# Patient Record
Sex: Male | Born: 1980
Health system: Southern US, Community
[De-identification: ages and names within clinical notes are randomized; demographics above are authoritative.]

## PROBLEM LIST (undated history)

## (undated) DIAGNOSIS — T783XXA Angioneurotic edema, initial encounter: Secondary | ICD-10-CM

## (undated) DIAGNOSIS — I10 Essential (primary) hypertension: Secondary | ICD-10-CM

## (undated) DIAGNOSIS — L509 Urticaria, unspecified: Secondary | ICD-10-CM

## (undated) DIAGNOSIS — Z8619 Personal history of other infectious and parasitic diseases: Secondary | ICD-10-CM

## (undated) HISTORY — DX: Urticaria, unspecified: L50.9

## (undated) HISTORY — DX: Personal history of other infectious and parasitic diseases: Z86.19

## (undated) HISTORY — DX: Angioneurotic edema, initial encounter: T78.3XXA

## (undated) HISTORY — DX: Essential (primary) hypertension: I10

## (undated) HISTORY — PX: NO PAST SURGERIES: SHX2092

---
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
## 2001-12-16 ENCOUNTER — Encounter: Payer: Self-pay | Admitting: Emergency Medicine

## 2001-12-16 ENCOUNTER — Emergency Department (HOSPITAL_COMMUNITY): Admission: EM | Admit: 2001-12-16 | Discharge: 2001-12-17 | Payer: Self-pay | Admitting: Emergency Medicine

## 2002-01-02 ENCOUNTER — Encounter: Admission: RE | Admit: 2002-01-02 | Discharge: 2002-04-02 | Payer: Self-pay | Admitting: Family Medicine

## 2012-01-03 ENCOUNTER — Emergency Department (HOSPITAL_BASED_OUTPATIENT_CLINIC_OR_DEPARTMENT_OTHER)
Admission: EM | Admit: 2012-01-03 | Discharge: 2012-01-03 | Disposition: A | Discharge status: Home / Self Care | Payer: No Typology Code available for payment source | Attending: Emergency Medicine | Admitting: Emergency Medicine

## 2012-01-03 ENCOUNTER — Encounter (HOSPITAL_BASED_OUTPATIENT_CLINIC_OR_DEPARTMENT_OTHER): Payer: Self-pay | Admitting: *Deleted

## 2012-01-03 DIAGNOSIS — Z043 Encounter for examination and observation following other accident: Secondary | ICD-10-CM | POA: Insufficient documentation

## 2012-01-03 DIAGNOSIS — S161XXA Strain of muscle, fascia and tendon at neck level, initial encounter: Secondary | ICD-10-CM

## 2012-01-03 DIAGNOSIS — M549 Dorsalgia, unspecified: Secondary | ICD-10-CM

## 2012-01-03 MED ORDER — IBUPROFEN 800 MG PO TABS: 800.0000 mg | ORAL_TABLET | Freq: Three times a day (TID) | ORAL | Status: AC

## 2012-01-03 MED ORDER — CYCLOBENZAPRINE HCL 10 MG PO TABS: 10.0000 mg | ORAL_TABLET | Freq: Two times a day (BID) | ORAL | Status: AC | PRN
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
## 2012-01-21 ENCOUNTER — Ambulatory Visit
Payer: No Typology Code available for payment source | Attending: Family Medicine | Admitting: Rehabilitative and Restorative Service Providers"

## 2012-01-21 DIAGNOSIS — M545 Low back pain, unspecified: Secondary | ICD-10-CM | POA: Insufficient documentation

## 2012-01-21 DIAGNOSIS — M542 Cervicalgia: Secondary | ICD-10-CM | POA: Insufficient documentation

## 2012-01-21 DIAGNOSIS — IMO0001 Reserved for inherently not codable concepts without codable children: Secondary | ICD-10-CM | POA: Insufficient documentation

## 2012-01-21 DIAGNOSIS — M2569 Stiffness of other specified joint, not elsewhere classified: Secondary | ICD-10-CM | POA: Insufficient documentation

## 2012-01-28 ENCOUNTER — Ambulatory Visit: Payer: No Typology Code available for payment source | Admitting: Physical Therapy

## 2012-02-01 ENCOUNTER — Ambulatory Visit: Payer: No Typology Code available for payment source | Admitting: Rehabilitative and Restorative Service Providers"

## 2012-02-01 ENCOUNTER — Ambulatory Visit: Payer: Self-pay | Admitting: Internal Medicine

## 2012-02-03 ENCOUNTER — Ambulatory Visit: Payer: No Typology Code available for payment source | Admitting: Rehabilitative and Restorative Service Providers"

## 2012-02-04 ENCOUNTER — Ambulatory Visit (INDEPENDENT_AMBULATORY_CARE_PROVIDER_SITE_OTHER): Payer: Managed Care, Other (non HMO) | Admitting: Family Medicine

## 2012-02-04 ENCOUNTER — Encounter: Payer: Self-pay | Admitting: Family Medicine

## 2012-02-04 VITALS — BP 125/88 | HR 75 | Ht 72.25 in | Wt 360.0 lb

## 2012-02-04 DIAGNOSIS — E785 Hyperlipidemia, unspecified: Secondary | ICD-10-CM

## 2012-02-04 DIAGNOSIS — Z0000 Encounter for general adult medical examination without abnormal findings: Secondary | ICD-10-CM

## 2012-02-04 LAB — POCT URINALYSIS DIPSTICK
Blood, UA: NEGATIVE
Glucose, UA: NEGATIVE
Ketones, UA: NEGATIVE
Nitrite, UA: NEGATIVE
Protein, UA: NEGATIVE
Spec Grav, UA: 1.02
Urobilinogen, UA: 1
pH, UA: 7

## 2012-02-04 NOTE — Patient Instructions (Signed)

## 2012-02-04 NOTE — Progress Notes (Signed)
Subjective:    Patient ID: Wesley Obrien, male    DOB: 07-26-1980, 31 y.o.   MRN: [id number]  HPI States everything is well health maintenance examination.   Review of Systems  Neurological: Positive for headaches.       Wesley Obrien has had a history of what Wesley Obrien called a migraine headaches. Wesley Obrien states that she's been told before by physicians as long as it doesn't cause major problems Wesley Obrien can do with it as Wesley Obrien's been doing. But mainly means trying to lay down when Wesley Obrien has a headache and getting some rest. Wesley Obrien states headaches only occurs maybe once every 2 months. Did tell him if it gets worse may need to consider a tryptan.  All other systems reviewed and are negative.    No Known Allergies History   Social History  . Marital Status: Single    Spouse Name: N/A    Number of Children: N/A  . Years of Education: N/A   Occupational History  . Not on file.   Social History Main Topics  . Smoking status: Current Some Day Smoker    Types: Cigars  . Smokeless tobacco: Never Used  . Alcohol Use: Yes  . Drug Use: No  . Sexually Active: Yes    Birth Control/ Protection: None   Other Topics Concern  . Not on file   Social History Narrative  . No narrative on file   Family History  Problem Relation Age of Onset  . Diabetes Father    History reviewed. No pertinent past medical history. History reviewed. No pertinent past surgical history.     BP 125/88  Pulse 75  Ht 6' 0.25" (1.835 m)  Wt 360 lb (163.295 kg)  BMI 48.49 kg/m2  SpO2 98% Objective:   Physical Exam  Vitals reviewed. Constitutional: Wesley Obrien is oriented to person, place, and time. Wesley Obrien appears well-developed and well-nourished. No distress.       Obese black male  HENT:  Head: Normocephalic and atraumatic.  Eyes: Conjunctivae normal and EOM are normal. Pupils are equal, round, and reactive to light. Right conjunctiva is not injected. Left conjunctiva is not injected.  Fundoscopic exam:      The right eye shows no arteriolar  narrowing, no AV nicking and no exudate.       The left eye shows no arteriolar narrowing, no AV nicking and no exudate.  Neck: Normal range of motion. Neck supple. No tracheal deviation present. No mass and no thyromegaly present.  Cardiovascular: Normal rate, regular rhythm and normal heart sounds.  Exam reveals no friction rub.   No murmur heard. Pulmonary/Chest: Effort normal and breath sounds normal. No respiratory distress. Wesley Obrien has no wheezes.  Abdominal: Soft. Bowel sounds are normal. Wesley Obrien exhibits no distension. There is no tenderness.  Genitourinary: Penis normal. No penile tenderness.  Musculoskeletal: Normal range of motion. Wesley Obrien exhibits no edema and no tenderness.  Neurological: Wesley Obrien is alert and oriented to person, place, and time. Wesley Obrien has normal reflexes. Wesley Obrien displays normal reflexes. No cranial nerve deficit. Coordination normal.  Skin: Skin is warm and dry. No rash noted. No erythema.  Psychiatric: Wesley Obrien has a normal mood and affect. His behavior is normal.   Results for orders placed in visit on 02/04/12  POCT URINALYSIS DIPSTICK      Component Value Range   Color, UA yellow     Clarity, UA clear     Glucose, UA neg     Bilirubin, UA neg     Ketones,  UA neg     Spec Grav, UA 1.020     Blood, UA neg     pH, UA 7.0     Protein, UA neg     Urobilinogen, UA 1.0     Nitrite, UA neg     Leukocytes, UA Negative        Assessment & Plan:  #1 metabolic syndrome. Explained to him my concern over his weight gain and elevated lipids found on screening test. Warned that diabetes can developed as well as hypertension and a heart disease. Wesley Obrien is going to hopefully start exercising we talked about the possible use of fish oil. Wesley Obrien is also been juicing to try to be healthier but I explained to him total calories have to be accounted for a while Wesley Obrien is getting good nutrition this may be more calories than Wesley Obrien can handle or need Wesley Obrien does admit to gaining weight we'll Wesley Obrien's been doing that  activity.  Given to patient information on a good multivitamin , and fish oil. Also weight loss program that is nonpharmaceutical. We'll have him return 6 months for followup and to go downstairs for lab work.  Offered flu shot patient declined.

## 2012-02-05 LAB — CBC WITH DIFFERENTIAL/PLATELET
Eosinophils Absolute: 0.1 10*3/uL (ref 0.0–0.7)
Eosinophils Relative: 1 % (ref 0–5)
Hemoglobin: 14.4 g/dL (ref 13.0–17.0)
Lymphs Abs: 1.6 10*3/uL (ref 0.7–4.0)
MCH: 28.9 pg (ref 26.0–34.0)
MCHC: 34.5 g/dL (ref 30.0–36.0)
MCV: 83.7 fL (ref 78.0–100.0)
Monocytes Absolute: 0.5 10*3/uL (ref 0.1–1.0)
Monocytes Relative: 8 % (ref 3–12)
Platelets: 222 10*3/uL (ref 150–400)
RBC: 4.98 MIL/uL (ref 4.22–5.81)
RDW: 14 % (ref 11.5–15.5)
WBC: 6.5 10*3/uL (ref 4.0–10.5)

## 2012-02-05 LAB — HEMOGLOBIN A1C: Hgb A1c MFr Bld: 6.3 % — ABNORMAL HIGH (ref <5.7)

## 2012-02-05 LAB — COMPREHENSIVE METABOLIC PANEL
AST: 18 U/L (ref 0–37)
Albumin: 4.3 g/dL (ref 3.5–5.2)
BUN: 11 mg/dL (ref 6–23)
CO2: 24 mEq/L (ref 19–32)
Calcium: 9.5 mg/dL (ref 8.4–10.5)
Creat: 1.07 mg/dL (ref 0.50–1.35)
Glucose, Bld: 95 mg/dL (ref 70–99)
Potassium: 4.3 mEq/L (ref 3.5–5.3)
Sodium: 137 mEq/L (ref 135–145)
Total Bilirubin: 0.9 mg/dL (ref 0.3–1.2)
Total Protein: 7.3 g/dL (ref 6.0–8.3)

## 2012-02-05 LAB — LIPID PANEL
Cholesterol: 204 mg/dL — ABNORMAL HIGH (ref 0–200)
HDL: 37 mg/dL — ABNORMAL LOW (ref >39)
Total CHOL/HDL Ratio: 5.5 Ratio
Triglycerides: 82 mg/dL (ref <150)
VLDL: 16 mg/dL (ref 0–40)

## 2012-02-05 LAB — TSH: TSH: 1.974 u[IU]/mL (ref 0.350–4.500)

## 2012-02-09 ENCOUNTER — Encounter: Payer: Self-pay | Admitting: Family Medicine

## 2012-02-10 ENCOUNTER — Ambulatory Visit: Payer: No Typology Code available for payment source | Admitting: Physical Therapy

## 2012-02-11 ENCOUNTER — Ambulatory Visit: Payer: No Typology Code available for payment source | Admitting: Rehabilitative and Restorative Service Providers"

## 2012-02-16 ENCOUNTER — Encounter: Payer: Self-pay | Admitting: Rehabilitative and Restorative Service Providers"

## 2012-02-18 ENCOUNTER — Encounter: Payer: Self-pay | Admitting: Rehabilitative and Restorative Service Providers"

## 2012-02-24 ENCOUNTER — Ambulatory Visit
Payer: No Typology Code available for payment source | Attending: Family Medicine | Admitting: Rehabilitative and Restorative Service Providers"

## 2012-02-24 DIAGNOSIS — M542 Cervicalgia: Secondary | ICD-10-CM | POA: Insufficient documentation

## 2012-02-24 DIAGNOSIS — M545 Low back pain, unspecified: Secondary | ICD-10-CM | POA: Insufficient documentation

## 2012-02-24 DIAGNOSIS — IMO0001 Reserved for inherently not codable concepts without codable children: Secondary | ICD-10-CM | POA: Insufficient documentation

## 2012-02-24 DIAGNOSIS — M2569 Stiffness of other specified joint, not elsewhere classified: Secondary | ICD-10-CM | POA: Insufficient documentation

## 2012-06-04 ENCOUNTER — Other Ambulatory Visit: Payer: Self-pay

## 2013-02-23 ENCOUNTER — Other Ambulatory Visit: Payer: Self-pay

## 2014-07-29 ENCOUNTER — Emergency Department (HOSPITAL_COMMUNITY)
Admission: EM | Admit: 2014-07-29 | Discharge: 2014-07-29 | Disposition: A | Discharge status: Home / Self Care | Payer: Managed Care, Other (non HMO) | Attending: Emergency Medicine | Admitting: Emergency Medicine

## 2014-07-29 ENCOUNTER — Encounter (HOSPITAL_COMMUNITY): Payer: Self-pay | Admitting: *Deleted

## 2014-07-29 DIAGNOSIS — S3992XA Unspecified injury of lower back, initial encounter: Secondary | ICD-10-CM | POA: Diagnosis present

## 2014-07-29 DIAGNOSIS — Z720 Tobacco use: Secondary | ICD-10-CM | POA: Diagnosis not present

## 2014-07-29 DIAGNOSIS — Y9389 Activity, other specified: Secondary | ICD-10-CM | POA: Diagnosis not present

## 2014-07-29 DIAGNOSIS — Y92410 Unspecified street and highway as the place of occurrence of the external cause: Secondary | ICD-10-CM | POA: Insufficient documentation

## 2014-07-29 DIAGNOSIS — Y998 Other external cause status: Secondary | ICD-10-CM | POA: Insufficient documentation

## 2014-07-29 MED ORDER — OXYCODONE-ACETAMINOPHEN 5-325 MG PO TABS: 1.0000 | ORAL_TABLET | ORAL | Status: DC | PRN

## 2014-07-29 MED ORDER — IBUPROFEN 600 MG PO TABS: 600.0000 mg | ORAL_TABLET | Freq: Four times a day (QID) | ORAL | Status: DC | PRN

## 2014-07-29 MED ORDER — METHOCARBAMOL 750 MG PO TABS: 750.0000 mg | ORAL_TABLET | Freq: Four times a day (QID) | ORAL | Status: DC
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
## 2014-07-29 NOTE — Discharge Instructions (Signed)

## 2016-06-17 ENCOUNTER — Ambulatory Visit (INDEPENDENT_AMBULATORY_CARE_PROVIDER_SITE_OTHER): Payer: Managed Care, Other (non HMO) | Admitting: Medical

## 2016-06-17 ENCOUNTER — Encounter: Payer: Self-pay | Admitting: Medical

## 2016-06-17 VITALS — BP 136/72 | HR 86 | Temp 98.1°F | Resp 16 | Ht 72.25 in | Wt 357.2 lb

## 2016-06-17 DIAGNOSIS — Z0000 Encounter for general adult medical examination without abnormal findings: Secondary | ICD-10-CM

## 2016-06-17 DIAGNOSIS — Z113 Encounter for screening for infections with a predominantly sexual mode of transmission: Secondary | ICD-10-CM

## 2016-06-17 LAB — POC URINALSYSI DIPSTICK (AUTOMATED)
Bilirubin, UA: NEGATIVE
Blood, UA: NEGATIVE
Glucose, UA: NEGATIVE
KETONES UA: NEGATIVE
Leukocytes, UA: NEGATIVE
Nitrite, UA: NEGATIVE
Protein, UA: NEGATIVE
Urobilinogen, UA: 0.2
pH, UA: 6

## 2016-06-17 NOTE — Patient Instructions (Addendum)
For you wellness exam today I have ordered cbc, cmp, tsh, lipid panel, ua and hiv.  Flu vaccine declined today  Recommend exercise and healthy diet.  We will let you know lab results as they come in.  Follow up date appointment will be determined after lab review.   Bp well controlled. Continue your current bp med.   Preventive Care 18-39 Years, Male Preventive care refers to lifestyle choices and visits with your health care provider that can promote health and wellness. What does preventive care include?  A yearly physical exam. This is also called an annual well check.  Dental exams once or twice a year.  Routine eye exams. Ask your health care provider how often you should have your eyes checked.  Personal lifestyle choices, including:  Daily care of your teeth and gums.  Regular physical activity.  Eating a healthy diet.  Avoiding tobacco and drug use.  Limiting alcohol use.  Practicing safe sex. What happens during an annual well check? The services and screenings done by your health care provider during your annual well check will depend on your age, overall health, lifestyle risk factors, and family history of disease. Counseling  Your health care provider may ask you questions about your:  Alcohol use.  Tobacco use.  Drug use.  Emotional well-being.  Home and relationship well-being.  Sexual activity.  Eating habits.  Work and work Statistician. Screening  You may have the following tests or measurements:  Height, weight, and BMI.  Blood pressure.  Lipid and cholesterol levels. These may be checked every 5 years starting at age 20.  Diabetes screening. This is done by checking your blood sugar (glucose) after you have not eaten for a while (fasting).  Skin check.  Hepatitis C blood test.  Hepatitis B blood test.  Sexually transmitted disease (STD) testing. Discuss your test results, treatment options, and if necessary, the need for  more tests with your health care provider. Vaccines  Your health care provider may recommend certain vaccines, such as:  Influenza vaccine. This is recommended every year.  Tetanus, diphtheria, and acellular pertussis (Tdap, Td) vaccine. You may need a Td booster every 10 years.  Varicella vaccine. You may need this if you have not been vaccinated.  HPV vaccine. If you are 47 or younger, you may need three doses over 6 months.  Measles, mumps, and rubella (MMR) vaccine. You may need at least one dose of MMR.You may also need a second dose.  Pneumococcal 13-valent conjugate (PCV13) vaccine. You may need this if you have certain conditions and have not been vaccinated.  Pneumococcal polysaccharide (PPSV23) vaccine. You may need one or two doses if you smoke cigarettes or if you have certain conditions.  Meningococcal vaccine. One dose is recommended if you are age 29-21 years and a first-year college student living in a residence hall, or if you have one of several medical conditions. You may also need additional booster doses.  Hepatitis A vaccine. You may need this if you have certain conditions or if you travel or work in places where you may be exposed to hepatitis A.  Hepatitis B vaccine. You may need this if you have certain conditions or if you travel or work in places where you may be exposed to hepatitis B.  Haemophilus influenzae type b (Hib) vaccine. You may need this if you have certain risk factors. Talk to your health care provider about which screenings and vaccines you need and how often you need  them. This information is not intended to replace advice given to you by your health care provider. Make sure you discuss any questions you have with your health care provider. Document Released: 06/02/2001 Document Revised: 12/25/2015 Document Reviewed: 02/05/2015 Elsevier Interactive Patient Education  2017 [employer].

## 2016-06-17 NOTE — Progress Notes (Signed)
Subjective:    Patient ID: Wesley Obrien, male    DOB: Feb 01, 1981, 36 y.o.   MRN: [id number]  HPI  Pt if for first time.  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 Pt states not exercising past 2 months, pt states moderate healthy diet but can improve, 3 cans cheer wine a week, engaged and has one sone 35 yo.  Pt has htn. Pt states bp has been controlled. Pt on benciar 40/12.5 Dr. Irven EasterlyJerry Delbert Darley wrote rx. No cardiac or neurologic signs or symptoms.  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   Pt is up to date on his tdap. Pt declines flu vaccine.      Review of Systems  Constitutional: Negative for chills, fatigue and fever.  HENT: Negative for congestion, ear discharge, ear pain, facial swelling, hearing loss and mouth sores.   Respiratory: Negative for cough, choking, chest tightness, shortness of breath and wheezing.   Cardiovascular: Negative for chest pain and palpitations.  Gastrointestinal: Negative for abdominal pain, constipation, nausea and vomiting.  Genitourinary: Negative for dysuria, enuresis, flank pain, frequency, penile swelling, testicular pain and urgency.  Musculoskeletal: Negative for back pain, joint swelling and neck stiffness.  Skin: Negative for rash.  Neurological: Negative for dizziness, syncope, speech difficulty, weakness and headaches.  Hematological: Negative for adenopathy. Does not bruise/bleed easily.  Psychiatric/Behavioral: Negative for behavioral problems, confusion, dysphoric mood and sleep disturbance. The patient is not nervous/anxious.     Past Medical History:  Diagnosis Date  . History of chicken pox   . Hypertension      Social History   Social History  . Marital status: Single    Spouse name: N/A  . Number of children: N/A  . Years of education: N/A   Occupational History  . Not on file.   Social History Main  Topics  . Smoking status: Former Smoker    Types: Cigars  . Smokeless tobacco: Never Used  . Alcohol use Yes  . Drug use: No  . Sexual activity: Yes    Birth control/ protection: None   Other Topics Concern  . Not on file   Social History Narrative  . No narrative on file    Past Surgical History:  Procedure Laterality Date  . NO PAST SURGERIES      Family History  Problem Relation Age of Onset  . Healthy Mother   . Diabetes Father   . Hypertension Father   . Healthy Sister   . Healthy Son     Allergies  Allergen Reactions  . Clonidine Derivatives Swelling    Facial swelling.    No current outpatient prescriptions on file prior to visit.   No current facility-administered medications on file prior to visit.     BP 136/72 (BP Location: Left Arm, Patient Position: Sitting, Cuff Size: Large)   Pulse 86   Temp 98.1 F (36.7 C) (Oral)   Resp 16   Ht 6' 0.25" (1.835 m)   Wt (!) 357 lb 4 oz (162 kg)   SpO2 98%   BMI 48.12 kg/m       Objective:   Physical Exam  General Mental Status- Alert. General Appearance- Not in acute distress.   Skin General: Color- Normal Color. Moisture- Normal Moisture.  Neck Carotid Arteries- Normal color. Moisture- Normal Moisture. No carotid bruits. No JVD.  Chest and Lung Exam Auscultation:  Breath Sounds:-Normal.  Cardiovascular Auscultation:Rythm- Regular. Murmurs & Other Heart Sounds:Auscultation of the heart reveals- No Murmurs.  Abdomen Inspection:-Inspeection Normal. Palpation/Percussion:Note:No mass. Palpation and Percussion of the abdomen reveal- Non Tender, Non Distended + BS, no rebound or guarding.(obese abdomen)   Neurologic Cranial Nerve exam:- CN III-XII intact(No nystagmus), symmetric smile. .Strength:- 5/5 equal and symmetric strength both upper and lower extremities.      Assessment & Plan:  For you wellness exam today I have ordered cbc, cmp, tsh, lipid panel, ua and hiv.  Flu vaccine  declined today  Recommend exercise and healthy diet.  We will let you know lab results as they come in.  Follow up date appointment will be determined after lab review  Bp well controlled. Continue your current bp med.  Mikeal Winstanley, Ramon Dredge, PA-C

## 2016-06-17 NOTE — Progress Notes (Signed)
Pre visit review using our clinic review tool, if applicable. No additional management support is needed unless otherwise documented below in the visit note/SLS  

## 2016-06-18 LAB — COMPREHENSIVE METABOLIC PANEL
ALT: 28 U/L (ref 0–53)
AST: 20 U/L (ref 0–37)
Albumin: 4.3 g/dL (ref 3.5–5.2)
Alkaline Phosphatase: 61 U/L (ref 39–117)
BILIRUBIN TOTAL: 0.7 mg/dL (ref 0.2–1.2)
BUN: 13 mg/dL (ref 6–23)
CALCIUM: 9.5 mg/dL (ref 8.4–10.5)
CO2: 31 mEq/L (ref 19–32)
CREATININE: 1.22 mg/dL (ref 0.40–1.50)
Chloride: 102 mEq/L (ref 96–112)
GFR: 87.12 mL/min (ref >60.00)
Glucose, Bld: 64 mg/dL — ABNORMAL LOW (ref 70–99)
Potassium: 4.1 mEq/L (ref 3.5–5.1)
Sodium: 138 mEq/L (ref 135–145)
Total Protein: 7.8 g/dL (ref 6.0–8.3)

## 2016-06-18 LAB — LIPID PANEL
CHOLESTEROL: 207 mg/dL — AB (ref 0–200)
HDL: 39.4 mg/dL (ref >39.00)
LDL Cholesterol: 158 mg/dL — ABNORMAL HIGH (ref 0–99)
NONHDL: 167.31
Total CHOL/HDL Ratio: 5
Triglycerides: 49 mg/dL (ref 0.0–149.0)
VLDL: 9.8 mg/dL (ref 0.0–40.0)

## 2016-06-18 LAB — CBC WITH DIFFERENTIAL/PLATELET
Basophils Absolute: 0.1 10*3/uL (ref 0.0–0.1)
Basophils Relative: 1.5 % (ref 0.0–3.0)
Eosinophils Absolute: 0.1 10*3/uL (ref 0.0–0.7)
Eosinophils Relative: 1.7 % (ref 0.0–5.0)
HEMATOCRIT: 45 % (ref 39.0–52.0)
HEMOGLOBIN: 14.7 g/dL (ref 13.0–17.0)
Lymphocytes Relative: 29.8 % (ref 12.0–46.0)
Lymphs Abs: 1.8 10*3/uL (ref 0.7–4.0)
MCHC: 32.6 g/dL (ref 30.0–36.0)
MCV: 87.3 fl (ref 78.0–100.0)
Monocytes Absolute: 0.6 10*3/uL (ref 0.1–1.0)
Monocytes Relative: 9.3 % (ref 3.0–12.0)
Neutro Abs: 3.5 10*3/uL (ref 1.4–7.7)
Neutrophils Relative %: 57.7 % (ref 43.0–77.0)
Platelets: 203 10*3/uL (ref 150.0–400.0)
RBC: 5.15 Mil/uL (ref 4.22–5.81)
RDW: 13.2 % (ref 11.5–15.5)
WBC: 6.1 10*3/uL (ref 4.0–10.5)

## 2016-06-18 LAB — TSH: TSH: 2.18 u[IU]/mL (ref 0.35–4.50)

## 2016-06-18 LAB — HIV ANTIBODY (ROUTINE TESTING W REFLEX): HIV: NONREACTIVE

## 2016-08-19 ENCOUNTER — Emergency Department (HOSPITAL_BASED_OUTPATIENT_CLINIC_OR_DEPARTMENT_OTHER)
Admission: EM | Admit: 2016-08-19 | Discharge: 2016-08-19 | Disposition: A | Discharge status: Home / Self Care | Payer: No Typology Code available for payment source | Attending: Emergency Medicine | Admitting: Emergency Medicine

## 2016-08-19 ENCOUNTER — Emergency Department (HOSPITAL_BASED_OUTPATIENT_CLINIC_OR_DEPARTMENT_OTHER): Payer: No Typology Code available for payment source

## 2016-08-19 ENCOUNTER — Encounter (HOSPITAL_BASED_OUTPATIENT_CLINIC_OR_DEPARTMENT_OTHER): Payer: Self-pay | Admitting: *Deleted

## 2016-08-19 DIAGNOSIS — Z87891 Personal history of nicotine dependence: Secondary | ICD-10-CM | POA: Insufficient documentation

## 2016-08-19 DIAGNOSIS — S39012A Strain of muscle, fascia and tendon of lower back, initial encounter: Secondary | ICD-10-CM | POA: Diagnosis not present

## 2016-08-19 DIAGNOSIS — S161XXA Strain of muscle, fascia and tendon at neck level, initial encounter: Secondary | ICD-10-CM | POA: Diagnosis not present

## 2016-08-19 DIAGNOSIS — Y92410 Unspecified street and highway as the place of occurrence of the external cause: Secondary | ICD-10-CM | POA: Diagnosis not present

## 2016-08-19 DIAGNOSIS — S199XXA Unspecified injury of neck, initial encounter: Secondary | ICD-10-CM | POA: Diagnosis present

## 2016-08-19 DIAGNOSIS — I10 Essential (primary) hypertension: Secondary | ICD-10-CM | POA: Insufficient documentation

## 2016-08-19 DIAGNOSIS — Y939 Activity, unspecified: Secondary | ICD-10-CM | POA: Insufficient documentation

## 2016-08-19 DIAGNOSIS — Y999 Unspecified external cause status: Secondary | ICD-10-CM | POA: Insufficient documentation

## 2016-08-19 MED ORDER — NAPROXEN 500 MG PO TABS: 500.0000 mg | ORAL_TABLET | Freq: Two times a day (BID) | ORAL | 0 refills | Status: DC

## 2016-08-19 MED ORDER — METHOCARBAMOL 500 MG PO TABS: 500.0000 mg | ORAL_TABLET | Freq: Two times a day (BID) | ORAL | 0 refills | Status: DC
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
## 2016-08-19 NOTE — Discharge Instructions (Signed)
Take Naprosyn for pain and inflammation as prescribed. Take Robaxin for muscle spasms. Try stretches, heating pads, you  can try Epsom salt baths. Follow-up with family doctor. Return if worsening.

## 2016-09-10 ENCOUNTER — Telehealth: Payer: Self-pay | Admitting: Medical

## 2016-09-10 NOTE — Telephone Encounter (Signed)
olmesartan-hydrochlorothiazide (BENICAR HCT) 40-12.5 MG tablet patient is asking for a refill for blood pressure medication he is out of this med. He is schedule to come for follow up 5/31 and 5/30 for workers comp back pain visit. He scheduled separate visits to keep claims separate. Patient uses [facility] on [address]

## 2016-09-11 ENCOUNTER — Telehealth: Payer: Self-pay | Admitting: Medical

## 2016-09-11 MED ORDER — OLMESARTAN MEDOXOMIL-HCTZ 40-12.5 MG PO TABS: 1.0000 | ORAL_TABLET | Freq: Every day | ORAL | 3 refills | Status: DC

## 2016-09-11 NOTE — Telephone Encounter (Signed)
I sent in pt refill of benicar/hctz. Please notify him.

## 2016-09-11 NOTE — Telephone Encounter (Signed)
Pt aware.//AB/CMA 

## 2016-09-16 ENCOUNTER — Ambulatory Visit (INDEPENDENT_AMBULATORY_CARE_PROVIDER_SITE_OTHER): Payer: Managed Care, Other (non HMO) | Admitting: Medical

## 2016-09-17 ENCOUNTER — Encounter: Payer: Self-pay | Admitting: Medical

## 2016-09-17 ENCOUNTER — Ambulatory Visit (INDEPENDENT_AMBULATORY_CARE_PROVIDER_SITE_OTHER): Payer: Managed Care, Other (non HMO) | Admitting: Medical

## 2016-09-17 VITALS — BP 120/80 | HR 92 | Temp 98.1°F | Resp 16 | Ht 77.0 in | Wt 359.2 lb

## 2016-09-17 DIAGNOSIS — M545 Low back pain, unspecified: Secondary | ICD-10-CM
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
## 2016-09-17 NOTE — Patient Instructions (Addendum)
Your back pain is almost resolved but very low level lingering. I want you to do back stretching exercises and can take low dose ibuprofen 200-400 mg on occasion if pain increases. I offered PT referral today but you want to wait a little to see if pain will taper off. If by 2-3 weeks low level pain not resolving please let me know and will refer to PT.  For high blood pressure continue current med.Bp very well controlled today.  Provided everything going well follow up sept or October for bp check and recommend flu vaccine at that time.  Note pt being seen for his back pain follow up from [facility]. Not charging bp follow up bp visit.   Back Exercises If you have pain in your back, do these exercises 2-3 times each day or as told by your doctor. When the pain goes away, do the exercises once each day, but repeat the steps more times for each exercise (do more repetitions). If you do not have pain in your back, do these exercises once each day or as told by your doctor. Exercises Single Knee to Chest  Do these steps 3-5 times in a row for each leg: 1. Lie on your back on a firm bed or the floor with your legs stretched out. 2. Bring one knee to your chest. 3. Hold your knee to your chest by grabbing your knee or thigh. 4. Pull on your knee until you feel a gentle stretch in your lower back. 5. Keep doing the stretch for 10-30 seconds. 6. Slowly let go of your leg and straighten it.  Pelvic Tilt  Do these steps 5-10 times in a row: 1. Lie on your back on a firm bed or the floor with your legs stretched out. 2. Bend your knees so they point up to the ceiling. Your feet should be flat on the floor. 3. Tighten your lower belly (abdomen) muscles to press your lower back against the floor. This will make your tailbone point up to the ceiling instead of pointing down to your feet or the floor. 4. Stay in this position for 5-10 seconds while you gently tighten your muscles and breathe  evenly.  Cat-Cow  Do these steps until your lower back bends more easily: 1. Get on your hands and knees on a firm surface. Keep your hands under your shoulders, and keep your knees under your hips. You may put padding under your knees. 2. Let your head hang down, and make your tailbone point down to the floor so your lower back is round like the back of a cat. 3. Stay in this position for 5 seconds. 4. Slowly lift your head and make your tailbone point up to the ceiling so your back hangs low (sags) like the back of a cow. 5. Stay in this position for 5 seconds.  Press-Ups  Do these steps 5-10 times in a row: 1. Lie on your belly (face-down) on the floor. 2. Place your hands near your head, about shoulder-width apart. 3. While you keep your back relaxed and keep your hips on the floor, slowly straighten your arms to raise the top half of your body and lift your shoulders. Do not use your back muscles. To make yourself more comfortable, you may change where you place your hands. 4. Stay in this position for 5 seconds. 5. Slowly return to lying flat on the floor.  Bridges  Do these steps 10 times in a row: 1. Lie on  your back on a firm surface. 2. Bend your knees so they point up to the ceiling. Your feet should be flat on the floor. 3. Tighten your butt muscles and lift your butt off of the floor until your waist is almost as high as your knees. If you do not feel the muscles working in your butt and the back of your thighs, slide your feet 1-2 inches farther away from your butt. 4. Stay in this position for 3-5 seconds. 5. Slowly lower your butt to the floor, and let your butt muscles relax.  If this exercise is too easy, try doing it with your arms crossed over your chest. Belly Crunches  Do these steps 5-10 times in a row: 1. Lie on your back on a firm bed or the floor with your legs stretched out. 2. Bend your knees so they point up to the ceiling. Your feet should be flat on  the floor. 3. Cross your arms over your chest. 4. Tip your chin a little bit toward your chest but do not bend your neck. 5. Tighten your belly muscles and slowly raise your chest just enough to lift your shoulder blades a tiny bit off of the floor. 6. Slowly lower your chest and your head to the floor.  Back Lifts Do these steps 5-10 times in a row: 1. Lie on your belly (face-down) with your arms at your sides, and rest your forehead on the floor. 2. Tighten the muscles in your legs and your butt. 3. Slowly lift your chest off of the floor while you keep your hips on the floor. Keep the back of your head in line with the curve in your back. Look at the floor while you do this. 4. Stay in this position for 3-5 seconds. 5. Slowly lower your chest and your face to the floor.  Contact a doctor if:  Your back pain gets a lot worse when you do an exercise.  Your back pain does not lessen 2 hours after you exercise. If you have any of these problems, stop doing the exercises. Do not do them again unless your doctor says it is okay. Get help right away if:  You have sudden, very bad back pain. If this happens, stop doing the exercises. Do not do them again unless your doctor says it is okay. This information is not intended to replace advice given to you by your health care provider. Make sure you discuss any questions you have with your health care provider. Document Released: 05/09/2010 Document Revised: 09/12/2015 Document Reviewed: 05/31/2014 Elsevier Interactive Patient Education  [employer]2018 [employer] Inc[employer]

## 2017-01-15 ENCOUNTER — Other Ambulatory Visit: Payer: Self-pay | Admitting: Medical

## 2017-01-15 NOTE — Telephone Encounter (Signed)
Pt was advised to F/U in August/asked pharm to advise pt to sched appt first/thx dmf

## 2017-04-08 ENCOUNTER — Encounter: Payer: Self-pay | Admitting: Medical

## 2017-04-08 ENCOUNTER — Ambulatory Visit (INDEPENDENT_AMBULATORY_CARE_PROVIDER_SITE_OTHER): Payer: BLUE CROSS/BLUE SHIELD | Admitting: Medical

## 2017-04-08 VITALS — BP 135/85 | HR 89 | Temp 98.1°F | Resp 16 | Ht 75.0 in | Wt 374.8 lb

## 2017-04-08 DIAGNOSIS — I10 Essential (primary) hypertension: Secondary | ICD-10-CM

## 2017-04-08 DIAGNOSIS — E7849 Other hyperlipidemia: Secondary | ICD-10-CM

## 2017-04-08 MED ORDER — OLMESARTAN MEDOXOMIL-HCTZ 40-12.5 MG PO TABS: 1.0000 | ORAL_TABLET | Freq: Every day | ORAL | 1 refills | Status: DC

## 2017-04-08 NOTE — Progress Notes (Signed)
Subjective:    Patient ID: Wesley Obrien, male    DOB: [birth year], 36 y.o.   MRN: [id number]  HPI  Pt in for blood pressure follow up/check. Pt has been taking his bp medication. He was not taking bp meds for 2 weeks but then restarted benicar just 2 days ago. No cardiac or neurologic signs or symptoms.  Pt was out of insurance for a while when made job change.  Intial high bp when first got here and was in a rush.  Repeat blood pressure check was better.  Pt declines flu vaccine.    Review of Systems  Constitutional: Negative for chills, fatigue and fever.  HENT: Negative for congestion.   Respiratory: Negative for cough, choking, chest tightness, shortness of breath and wheezing.   Cardiovascular: Negative for chest pain and palpitations.  Gastrointestinal: Negative for abdominal distention, abdominal pain, nausea and vomiting.  Musculoskeletal: Negative for back pain.  Skin: Negative for rash.  Neurological: Negative for dizziness, seizures, speech difficulty, weakness and headaches.  Hematological: Negative for adenopathy. Does not bruise/bleed easily.  Psychiatric/Behavioral: Negative for behavioral problems and confusion.    Past Medical History:  Diagnosis Date  . History of chicken pox   . Hypertension      Social History   Socioeconomic History  . Marital status: Single    Spouse name: Not on file  . Number of children: Not on file  . Years of education: Not on file  . Highest education level: Not on file  Social Needs  . Financial resource strain: Not on file  . Food insecurity - worry: Not on file  . Food insecurity - inability: Not on file  . Transportation needs - medical: Not on file  . Transportation needs - non-medical: Not on file  Occupational History  . Not on file  Tobacco Use  . Smoking status: Former Smoker    Types: Cigars    Last attempt to quit: 04/16/2016    Years since quitting: 0.9  . Smokeless tobacco: Never Used  Substance and  Sexual Activity  . Alcohol use: Yes    Comment: 6 pack 2-3 times a month.  . Drug use: No  . Sexual activity: Yes    Birth control/protection: None  Other Topics Concern  . Not on file  Social History Narrative  . Not on file    Past Surgical History:  Procedure Laterality Date  . NO PAST SURGERIES      Family History  Problem Relation Age of Onset  . Healthy Mother   . Diabetes Father   . Hypertension Father   . Healthy Sister   . Healthy Son     Allergies  Allergen Reactions  . Clonidine Derivatives Swelling    Facial swelling.    Current Outpatient Medications on File Prior to Visit  Medication Sig Dispense Refill  . olmesartan-hydrochlorothiazide (BENICAR HCT) 40-12.5 MG tablet Take 1 tablet by mouth daily. 30 tablet 3   No current facility-administered medications on file prior to visit.     BP (!) 148/100   Pulse 89   Temp 98.1 F (36.7 C) (Oral)   Resp 16   Ht 6\' 3"  (1.905 m)   Wt (!) 374 lb 12.8 oz (170 kg)   SpO2 97%   BMI 46.85 kg/m       Objective:   Physical Exam  General Mental Status- Alert. General Appearance- Not in acute distress.   Skin General: Color- Normal Color. Moisture- Normal Moisture.  Neck Carotid Arteries- Normal color. Moisture- Normal Moisture. No carotid bruits. No JVD.  Chest and Lung Exam Auscultation: Breath Sounds:-Normal.  Cardiovascular Auscultation:Rythm- Regular. Murmurs & Other Heart Sounds:Auscultation of the heart reveals- No Murmurs.  Abdomen Inspection:-Inspeection Normal. Palpation/Percussion:Note:No mass. Palpation and Percussion of the abdomen reveal- Non Tender, Non Distended + BS, no rebound or guarding.  Neurologic Cranial Nerve exam:- CN III-XII intact(No nystagmus), symmetric smile. Finger to Nose:- Normal/Intact Strength:- 5/5 equal and symmetric strength both upper and lower extremities.      Assessment & Plan:  Your bp is good today. Initially high bp but when I checked bp came  down when I checked.. I am refilling your bp medication today. Get more exercise and eat healthy.  Get future labs sometime next week.  Follow up in 6 months or as needed  Wesley Obrien, Wesley Obrien, VF CorporationPA-C

## 2017-04-08 NOTE — Patient Instructions (Addendum)
Your bp is good today. Initially high bp but when I checked bp came down when I checked. I am refilling your bp medication today. Get more exercise and eat healthy.  Get future labs sometime next week.  Follow up in 6 months or as needed

## 2017-04-09 ENCOUNTER — Encounter: Payer: Self-pay | Admitting: Medical

## 2017-08-30 ENCOUNTER — Ambulatory Visit: Payer: BLUE CROSS/BLUE SHIELD | Admitting: Medical

## 2017-08-30 ENCOUNTER — Encounter: Payer: Self-pay | Admitting: Medical

## 2017-08-30 VITALS — BP 140/101 | HR 74 | Temp 98.0°F | Resp 16 | Ht 77.0 in | Wt 370.0 lb

## 2017-08-30 DIAGNOSIS — J40 Bronchitis, not specified as acute or chronic: Secondary | ICD-10-CM | POA: Diagnosis not present

## 2017-08-30 DIAGNOSIS — J301 Allergic rhinitis due to pollen: Secondary | ICD-10-CM

## 2017-08-30 DIAGNOSIS — I10 Essential (primary) hypertension: Secondary | ICD-10-CM | POA: Diagnosis not present

## 2017-08-30 MED ORDER — AZELASTINE HCL 0.1 % NA SOLN: 2.0000 | Freq: Two times a day (BID) | NASAL | 3 refills | Status: DC

## 2017-08-30 MED ORDER — OLMESARTAN MEDOXOMIL-HCTZ 40-12.5 MG PO TABS: 1.0000 | ORAL_TABLET | Freq: Every day | ORAL | 1 refills | Status: DC

## 2017-08-30 MED ORDER — LOSARTAN POTASSIUM-HCTZ 100-12.5 MG PO TABS: 1.0000 | ORAL_TABLET | Freq: Every day | ORAL | 2 refills | Status: DC

## 2017-08-30 MED ORDER — LEVOCETIRIZINE DIHYDROCHLORIDE 5 MG PO TABS: 5.0000 mg | ORAL_TABLET | Freq: Every evening | ORAL | 0 refills | Status: DC

## 2017-08-30 MED ORDER — BENZONATATE 100 MG PO CAPS: 100.0000 mg | ORAL_CAPSULE | Freq: Three times a day (TID) | ORAL | 0 refills | Status: DC | PRN

## 2017-08-30 MED ORDER — AZITHROMYCIN 250 MG PO TABS: ORAL_TABLET | ORAL | 0 refills | Status: DC

## 2017-08-30 MED FILL — LOSARTAN-HCTZ 100-12.5 MG T: 100-12.5 | 30 days supply | Qty: 30 | Fill #0

## 2017-08-30 NOTE — Progress Notes (Signed)
Subjective:    Patient ID: Wesley Obrien, male    DOB: 1980-07-15, 37 y.o.   MRN: [id number]  HPI  Pt in with some chest congestion with intermittent bouts of productive cough. Symptoms since Friday. Chest congestion worse today.  Early on some eye itching, nasal congestion and pnd over past week. (hx of severe allrgies in the past)  Pt tried some nasacort but did not help much.   When coughs brings up colored mucus most of the time.  Pt not smoking. He does vape a little. He states recently when vapes will cough so not currently.  Pt blood pressure medication is on back order. He has been out of bp medication since Thursday.    Review of Systems  Constitutional: Negative for appetite change, diaphoresis and fatigue.  HENT: Positive for congestion and postnasal drip. Negative for hearing loss, nosebleeds, sinus pressure, sinus pain and sore throat.        No ear pain.  Respiratory: Positive for cough. Negative for shortness of breath and wheezing.        Chest congestion.  Cardiovascular: Negative for chest pain and palpitations.  Gastrointestinal: Negative for abdominal pain and anal bleeding.  Musculoskeletal: Negative for back pain and gait problem.  Skin: Negative for rash.  Neurological: Negative for dizziness, speech difficulty, weakness and numbness.  Hematological: Negative for adenopathy. Does not bruise/bleed easily.  Psychiatric/Behavioral: Negative for behavioral problems, confusion and suicidal ideas. The patient is not nervous/anxious.    Past Medical History:  Diagnosis Date  . History of chicken pox   . Hypertension      Social History   Socioeconomic History  . Marital status: Single    Spouse name: Not on file  . Number of children: Not on file  . Years of education: Not on file  . Highest education level: Not on file  Occupational History  . Not on file  Social Needs  . Financial resource strain: Not on file  . Food insecurity:    Worry: Not  on file    Inability: Not on file  . Transportation needs:    Medical: Not on file    Non-medical: Not on file  Tobacco Use  . Smoking status: Former Smoker    Types: Cigars    Last attempt to quit: 04/16/2016    Years since quitting: 1.3  . Smokeless tobacco: Never Used  Substance and Sexual Activity  . Alcohol use: Yes    Comment: 6 pack 2-3 times a month.  . Drug use: No  . Sexual activity: Yes    Birth control/protection: None  Lifestyle  . Physical activity:    Days per week: Not on file    Minutes per session: Not on file  . Stress: Not on file  Relationships  . Social connections:    Talks on phone: Not on file    Gets together: Not on file    Attends religious service: Not on file    Active member of club or organization: Not on file    Attends meetings of clubs or organizations: Not on file    Relationship status: Not on file  . Intimate partner violence:    Fear of current or ex partner: Not on file    Emotionally abused: Not on file    Physically abused: Not on file    Forced sexual activity: Not on file  Other Topics Concern  . Not on file  Social History Narrative  . Not on  file    Past Surgical History:  Procedure Laterality Date  . NO PAST SURGERIES      Family History  Problem Relation Age of Onset  . Healthy Mother   . Diabetes Father   . Hypertension Father   . Healthy Sister   . Healthy Son     Allergies  Allergen Reactions  . Clonidine Derivatives Swelling    Facial swelling.    Current Outpatient Medications on File Prior to Visit  Medication Sig Dispense Refill  . olmesartan-hydrochlorothiazide (BENICAR HCT) 40-12.5 MG tablet Take 1 tablet by mouth daily. 90 tablet 1   No current facility-administered medications on file prior to visit.     BP (!) 140/101   Pulse 74   Temp 98 F (36.7 C) (Oral)   Resp 16   Ht  (1.956 m)   Wt (!) 370 lb (167.8 kg)   SpO2 98%   BMI 43.88 kg/m       Objective:   Physical  Exam   General  Mental Status - Alert. General Appearance - Well groomed. Not in acute distress.  Skin Rashes- No Rashes.  HEENT Head- Normal. Ear Auditory Canal - Left- Normal. Right - Normal.Tympanic Membrane- Left- Normal. Right- Normal. Eye Sclera/Conjunctiva- Left- Normal. Right- Normal. Nose & Sinuses Nasal Mucosa- Left-  Boggy and Congested. Right-  Boggy and  Congested.Bilateral no  maxillary and no frontal sinus pressure. Mouth & Throat Lips: Upper Lip- Normal: no dryness, cracking, pallor, cyanosis, or vesicular eruption. Lower Lip-Normal: no dryness, cracking, pallor, cyanosis or vesicular eruption. Buccal Mucosa- Bilateral- No Aphthous ulcers. Oropharynx- No Discharge or Erythema. Tonsils: Characteristics- Bilateral- No Erythema or Congestion. Size/Enlargement- Bilateral- No enlargement. Discharge- bilateral-None.  Neck Neck- Supple. No Masses.   Chest and Lung Exam Auscultation: Breath Sounds:-Clear even and unlabored.  Cardiovascular Auscultation:Rythm- Regular, rate and rhythm. Murmurs & Other Heart Sounds:Ausculatation of the heart reveal- No Murmurs.  Lymphatic Head & Neck General Head & Neck Lymphatics: Bilateral: Description- No Localized lymphadenopathy.      Assessment & Plan:  You do appear to have recent allergic rhinitis symptoms and now some bronchitis symptoms.  For allergies rx aselin and xyzal. Continue nasocort.  For cough rx benzonatate.  For bronchitis rx azithromycin.  For htn rx of losartan-hctz 100-12.5 mg. Benicar-hctz  not in stock.(only 30 tabs and not generic)  Follow up in 2-3 weeks bp check or as needed  Esperanza Richters, PA-C

## 2017-08-30 NOTE — Patient Instructions (Addendum)
You do appear to have recent allergic rhinitis symptoms and now some bronchitis symptoms.  For allergies rx aselin and xyzal. Continue nasocort.  For cough rx benzonatate.  For bronchitis rx azithromycin.  For htn rx of losartan-hctz 100-12.5 mg. Benicar-hctz  not in stock.(only 30 tabs and not generic)  Follow up in 2-3 weeks bp check or as needed

## 2017-09-02 ENCOUNTER — Ambulatory Visit: Payer: BLUE CROSS/BLUE SHIELD | Admitting: Medical

## 2017-09-27 ENCOUNTER — Other Ambulatory Visit: Payer: Self-pay | Admitting: Medical

## 2017-09-27 MED FILL — LOSARTAN-HCTZ 100-12.5 MG T: 100-12.5 | 30 days supply | Qty: 30 | Fill #1

## 2017-11-03 MED FILL — LOSARTAN-HCTZ 100-12.5 MG T: 100-12.5 | 30 days supply | Qty: 30 | Fill #2

## 2017-12-10 MED FILL — LOSARTAN-HCTZ 100-12.5 MG T: 100-12.5 | 30 days supply | Qty: 30 | Fill #3

## 2018-01-11 MED FILL — LOSARTAN-HCTZ 100-12.5 MG T: 100-12.5 | 30 days supply | Qty: 30 | Fill #4

## 2018-02-07 ENCOUNTER — Ambulatory Visit: Payer: BLUE CROSS/BLUE SHIELD | Admitting: Medical

## 2018-02-07 ENCOUNTER — Encounter: Payer: Self-pay | Admitting: Medical

## 2018-02-07 VITALS — BP 149/89 | HR 72 | Temp 98.1°F | Resp 16 | Ht 77.0 in | Wt 377.6 lb

## 2018-02-07 DIAGNOSIS — E7849 Other hyperlipidemia: Secondary | ICD-10-CM

## 2018-02-07 DIAGNOSIS — I10 Essential (primary) hypertension: Secondary | ICD-10-CM

## 2018-02-07 LAB — COMPREHENSIVE METABOLIC PANEL
ALK PHOS: 64 U/L (ref 39–117)
ALT: 21 U/L (ref 0–53)
AST: 14 U/L (ref 0–37)
Albumin: 4 g/dL (ref 3.5–5.2)
BUN: 16 mg/dL (ref 6–23)
CALCIUM: 9.1 mg/dL (ref 8.4–10.5)
CO2: 30 mEq/L (ref 19–32)
Chloride: 103 mEq/L (ref 96–112)
Creatinine, Ser: 1.26 mg/dL (ref 0.40–1.50)
GFR: 83.15 mL/min (ref >60.00)
Glucose, Bld: 103 mg/dL — ABNORMAL HIGH (ref 70–99)
Potassium: 4.5 mEq/L (ref 3.5–5.1)
Sodium: 138 mEq/L (ref 135–145)
TOTAL PROTEIN: 7 g/dL (ref 6.0–8.3)
Total Bilirubin: 0.5 mg/dL (ref 0.2–1.2)

## 2018-02-07 LAB — LIPID PANEL
Cholesterol: 198 mg/dL (ref 0–200)
HDL: 34 mg/dL — AB (ref >39.00)
LDL Cholesterol: 154 mg/dL — ABNORMAL HIGH (ref 0–99)
NonHDL: 163.95
Total CHOL/HDL Ratio: 6
Triglycerides: 48 mg/dL (ref 0.0–149.0)
VLDL: 9.6 mg/dL (ref 0.0–40.0)

## 2018-02-07 MED ORDER — AMLODIPINE BESYLATE 10 MG PO TABS: 10.0000 mg | ORAL_TABLET | Freq: Every day | ORAL | 3 refills | Status: DC

## 2018-02-07 MED ORDER — OLMESARTAN MEDOXOMIL-HCTZ 40-12.5 MG PO TABS: 1.0000 | ORAL_TABLET | Freq: Every day | ORAL | 5 refills | Status: DC

## 2018-02-07 MED FILL — OLMESARTAN-HCTZ 40-12.5 MG: 40-12.5 | 30 days supply | Qty: 30 | Fill #0

## 2018-02-07 NOTE — Progress Notes (Signed)
Subjective:    Patient ID: Wesley Obrien, male    DOB: 10-Jan-1981, 37 y.o.   MRN: [id number]  HPI  Pt is in for bp check.   His bp has been high in the past. He has been on losartan-hctz. Historically not controlled and his bp he has not followed up since May, 2019. No cardiac signs or symptoms.   Pt had ha on Saturday. He did not check his bp. No gross motor or sensory function deficits. He took excedrin and took nap. Then felt better. Pt states he gets rare HA. No chronic hx. No ha now and no associated neurologic signs or symptoms.  He does have history of high cholesterol. Will check level today.   Review of Systems  Constitutional: Negative for chills, fatigue and fever.  Respiratory: Negative for chest tightness, shortness of breath and wheezing.   Cardiovascular: Negative for chest pain and palpitations.  Gastrointestinal: Negative for abdominal pain.  Musculoskeletal: Negative for back pain, myalgias and neck pain.  Skin: Negative for rash.  Neurological: Negative for dizziness, seizures, weakness and headaches.  Hematological: Negative for adenopathy. Does not bruise/bleed easily.  Psychiatric/Behavioral: Negative for behavioral problems and confusion. The patient is not nervous/anxious.    Past Medical History:  Diagnosis Date  . History of chicken pox   . Hypertension      Social History   Socioeconomic History  . Marital status: Single    Spouse name: Not on file  . Number of children: Not on file  . Years of education: Not on file  . Highest education level: Not on file  Occupational History  . Not on file  Social Needs  . Financial resource strain: Not on file  . Food insecurity:    Worry: Not on file    Inability: Not on file  . Transportation needs:    Medical: Not on file    Non-medical: Not on file  Tobacco Use  . Smoking status: Former Smoker    Types: Cigars    Last attempt to quit: 04/16/2016    Years since quitting: 1.8  . Smokeless  tobacco: Never Used  Substance and Sexual Activity  . Alcohol use: Yes    Comment: 6 pack 2-3 times a month.  . Drug use: No  . Sexual activity: Yes    Birth control/protection: None  Lifestyle  . Physical activity:    Days per week: Not on file    Minutes per session: Not on file  . Stress: Not on file  Relationships  . Social connections:    Talks on phone: Not on file    Gets together: Not on file    Attends religious service: Not on file    Active member of club or organization: Not on file    Attends meetings of clubs or organizations: Not on file    Relationship status: Not on file  . Intimate partner violence:    Fear of current or ex partner: Not on file    Emotionally abused: Not on file    Physically abused: Not on file    Forced sexual activity: Not on file  Other Topics Concern  . Not on file  Social History Narrative  . Not on file    Past Surgical History:  Procedure Laterality Date  . NO PAST SURGERIES      Family History  Problem Relation Age of Onset  . Healthy Mother   . Diabetes Father   . Hypertension Father   .  Healthy Sister   . Healthy Son     Allergies  Allergen Reactions  . Clonidine Derivatives Swelling    Facial swelling.    Current Outpatient Medications on File Prior to Visit  Medication Sig Dispense Refill  . azelastine (ASTELIN) 0.1 % nasal spray Place 2 sprays into both nostrils 2 (two) times daily. Use in each nostril as directed 30 mL 3  . losartan-hydrochlorothiazide (HYZAAR) 100-12.5 MG tablet Take 1 tablet by mouth daily. 90 tablet 2   No current facility-administered medications on file prior to visit.     BP (!) 148/110   Pulse 72   Temp 98.1 F (36.7 C) (Oral)   Resp 16   Ht 6\' 5"  (1.956 m)   Wt (!) 377 lb 9.6 oz (171.3 kg)   SpO2 96%   BMI 44.78 kg/m       Objective:   Physical Exam  General Mental Status- Alert. General Appearance- Not in acute distress.   Skin General: Color- Normal Color.  Moisture- Normal Moisture.  Neck Carotid Arteries- Normal color. Moisture- Normal Moisture. No carotid bruits. No JVD.  Chest and Lung Exam Auscultation: Breath Sounds:-Normal.  Cardiovascular Auscultation:Rythm- Regular. Murmurs & Other Heart Sounds:Auscultation of the heart reveals- No Murmurs.  Abdomen Inspection:-Inspeection Normal. Palpation/Percussion:Note:No mass. Palpation and Percussion of the abdomen reveal- Non Tender, Non Distended + BS, no rebound or guarding.    Neurologic Cranial Nerve exam:- CN III-XII intact(No nystagmus), symmetric smile. Strength:- 5/5 equal and symmetric strength both upper and lower extremities.     Assessment & Plan:  Your blood pressure is high today.  I do want to see her blood pressure less than 140/90 and ideally closer to 130/80.  You have had recent headache this weekend and unsure as to exact cause but I do have some suspicion your blood pressure may have been elevated.  Currently you have a normal neurologic exam.  I am prescribing olmesartan/HCTZ.  This would be in place of losartan/HCTZ.  You report that you think that olmesartan was more potent in the past.  I do want you to check your blood pressures daily and if your blood pressures are not less than 140/90 then would asked that you also add on amlodipine 10 mg daily.  The print prescription of amlodipine is given to you today.  Also will check your metabolic panel and lipid panel today.  In the past I have asked you to do diet modification and exercise.  If lipids are still elevated would advise statin Rx.  Follow-up in 2 weeks or as needed.  Esperanza Richters, PA-C

## 2018-02-07 NOTE — Patient Instructions (Addendum)
Your blood pressure is high today.  I do want to see her blood pressure less than 140/90 and ideally closer to 130/80.  You have had recent headache this weekend and unsure as to exact cause but I do have some suspicion your blood pressure may have been elevated.  Currently you have a normal neurologic exam.  I am prescribing olmesartan/HCTZ.  This would be in place of losartan/HCTZ.  You report that you think that olmesartan was more potent in the past.  I do want you to check your blood pressures daily and if your blood pressures are not less than 140/90 then would asked that you also add on amlodipine 10 mg daily.  The print prescription of amlodipine is given to you today.  Also will check your metabolic panel and lipid panel today.  In the past I have asked you to do diet modification and exercise.  If lipids are still elevated would advise statin Rx.  Follow-up in 2 weeks or as needed.

## 2018-02-08 ENCOUNTER — Ambulatory Visit: Payer: BLUE CROSS/BLUE SHIELD | Admitting: Medical

## 2018-03-10 MED FILL — OLMESARTAN-HCTZ 40-12.5 MG: 40-12.5 | 30 days supply | Qty: 30 | Fill #1

## 2018-04-12 MED FILL — OLMESARTAN-HCTZ 40-12.5 MG: 40-12.5 | 30 days supply | Qty: 30 | Fill #0

## 2018-05-18 MED FILL — OLMESARTAN-HCTZ 40-12.5 MG: 40-12.5 | 30 days supply | Qty: 30 | Fill #1

## 2018-06-22 MED FILL — OLMESARTAN-HCTZ 40-12.5 MG: 40-12.5 | 30 days supply | Qty: 30 | Fill #2

## 2018-07-25 MED FILL — OLMESARTAN-HCTZ 40-12.5 MG: 40-12.5 | 30 days supply | Qty: 30 | Fill #3

## 2018-07-26 ENCOUNTER — Telehealth: Payer: Self-pay | Admitting: Medical

## 2018-07-26 NOTE — Telephone Encounter (Signed)
Mychart message sent- virtual visit?  

## 2018-07-26 NOTE — Telephone Encounter (Signed)
Pt has htn. Was not in control in October. Please inform and get scheduled on virtual visit. He needs refill on meds likely.

## 2018-09-01 ENCOUNTER — Other Ambulatory Visit: Payer: Self-pay | Admitting: Medical

## 2018-09-02 MED ORDER — OLMESARTAN MEDOXOMIL-HCTZ 40-12.5 MG PO TABS: 1.0000 | ORAL_TABLET | Freq: Every day | ORAL | 5 refills | Status: DC

## 2018-09-08 ENCOUNTER — Ambulatory Visit (INDEPENDENT_AMBULATORY_CARE_PROVIDER_SITE_OTHER): Payer: BLUE CROSS/BLUE SHIELD | Admitting: Medical

## 2018-09-08 ENCOUNTER — Other Ambulatory Visit: Payer: Self-pay

## 2018-09-08 DIAGNOSIS — I10 Essential (primary) hypertension: Secondary | ICD-10-CM | POA: Diagnosis not present

## 2018-09-08 DIAGNOSIS — N451 Epididymitis: Secondary | ICD-10-CM | POA: Diagnosis not present

## 2018-09-08 MED ORDER — OLMESARTAN MEDOXOMIL 40 MG PO TABS: 40.0000 mg | ORAL_TABLET | Freq: Every day | ORAL | 1 refills | Status: DC

## 2018-09-08 MED ORDER — CIPROFLOXACIN HCL 500 MG PO TABS: 500.0000 mg | ORAL_TABLET | Freq: Two times a day (BID) | ORAL | 0 refills | Status: DC

## 2018-09-08 MED ORDER — AMLODIPINE BESYLATE 5 MG PO TABS: 5.0000 mg | ORAL_TABLET | Freq: Every day | ORAL | 0 refills | Status: DC

## 2018-09-08 NOTE — Patient Instructions (Addendum)
We don't know if your bp is elevated since you did not check. Please find your bp monitor so you can check your bp on occasion. We will check bp next week when you are in for evaluation for testicle.  For testicle pain/mild will treat with cipro antibiotic. But check area in one week to see if you need [place] or urine studies. If pain worsens/severe then ED.  Counseled/educated  Follow up in one week or as needed

## 2018-09-08 NOTE — Progress Notes (Signed)
Subjective:    Patient ID: Wesley Obrien, male    DOB: 07-25-1980, 38 y.o.   MRN: [id number]  HPI  Virtual Visit via Video Note  I connected with Wesley Obrien on 09/08/18 at  8:20 AM EDT by a video enabled telemedicine application and verified that I am speaking with the correct person using two identifiers.  Location: Patient: home Provider: home  Pt did not check his vitals today.   I discussed the limitations of evaluation and management by telemedicine and the availability of in person appointments. The patient expressed understanding and agreed to proceed.  History of Present Illness:  Pt states he is inconsistent checking his bp. Last time he checked bp was few weeks ago. He is not sure of reading.(he states told maybe little high). He states he was just on [place] since he states bp was controlled. He states it worked but he urinated too much. Pt has no cardiac or neurologic signs or symptoms. Ran out of medicine last week Thursday.  Pt also states/updates me of some mild occasional pain left testicle. On bulge in groin area.    Observations/Objective: General- no acute distress.pleasant. Lungs- unlabored respiration. Neuro- gross motor function appear normal. Genital- pt self exam. Epidyms/upper area testicle are pain   Assessment and Plan: We don't know if your bp is elevated since you did not check. Please find your bp monitor so you can check your bp on occasion. We will check bp next week when you are in for evaluation for testicle.  For testicle pain/mild will treat with cipro antibiotic. But check area in one week to see if you need [place] or urine studies. If pain worsens/severe then ED.  Counseled/educated  Follow up in one week or as needed   Esperanza Richters, PA-C     Follow Up Instructions:    I discussed the assessment and treatment plan with the patient. The patient was provided an opportunity to ask questions and all were answered. The  patient agreed with the plan and demonstrated an understanding of the instructions.   The patient was advised to call back or seek an in-person evaluation if the symptoms worsen or if the condition fails to improve as anticipated.     Esperanza Richters, PA-C    Review of Systems  Constitutional: Negative for chills, fatigue and fever.       No covid symptoms.  HENT: Negative for congestion and ear pain.   Respiratory: Negative for cough, chest tightness, shortness of breath and wheezing.   Cardiovascular: Negative for chest pain and palpitations.  Gastrointestinal: Negative for abdominal pain.  Genitourinary: Negative for dysuria, flank pain and frequency.  Musculoskeletal: Negative for back pain and myalgias.  Neurological: Negative for dizziness, syncope, speech difficulty, weakness, numbness and headaches.  Hematological: Negative for adenopathy. Does not bruise/bleed easily.  Psychiatric/Behavioral: Negative for behavioral problems and confusion.   Past Medical History:  Diagnosis Date  . History of chicken pox   . Hypertension      Social History   Socioeconomic History  . Marital status: Single    Spouse name: Not on file  . Number of children: Not on file  . Years of education: Not on file  . Highest education level: Not on file  Occupational History  . Not on file  Social Needs  . Financial resource strain: Not on file  . Food insecurity:    Worry: Not on file    Inability: Not on file  . Transportation  needs:    Medical: Not on file    Non-medical: Not on file  Tobacco Use  . Smoking status: Former Smoker    Types: Cigars    Last attempt to quit: 04/16/2016    Years since quitting: 2.3  . Smokeless tobacco: Never Used  Substance and Sexual Activity  . Alcohol use: Yes    Comment: 6 pack 2-3 times a month.  . Drug use: No  . Sexual activity: Yes    Birth control/protection: None  Lifestyle  . Physical activity:    Days per week: Not on file     Minutes per session: Not on file  . Stress: Not on file  Relationships  . Social connections:    Talks on phone: Not on file    Gets together: Not on file    Attends religious service: Not on file    Active member of club or organization: Not on file    Attends meetings of clubs or organizations: Not on file    Relationship status: Not on file  . Intimate partner violence:    Fear of current or ex partner: Not on file    Emotionally abused: Not on file    Physically abused: Not on file    Forced sexual activity: Not on file  Other Topics Concern  . Not on file  Social History Narrative  . Not on file    Past Surgical History:  Procedure Laterality Date  . NO PAST SURGERIES      Family History  Problem Relation Age of Onset  . Healthy Mother   . Diabetes Father   . Hypertension Father   . Healthy Sister   . Healthy Son     Allergies  Allergen Reactions  . Clonidine Derivatives Swelling    Facial swelling.    Current Outpatient Medications on File Prior to Visit  Medication Sig Dispense Refill  . azelastine (ASTELIN) 0.1 % nasal spray Place 2 sprays into both nostrils 2 (two) times daily. Use in each nostril as directed 30 mL 3  . olmesartan-hydrochlorothiazide (BENICAR HCT) 40-12.5 MG tablet Take 1 tablet by mouth daily. 30 tablet 5   No current facility-administered medications on file prior to visit.     There were no vitals taken for this visit.      Objective:   Physical Exam        Assessment & Plan:   We don't know if your bp is elevated since you did not check. Please find your bp monitor so you can check your bp on occasion. We will check bp next week when you are in for evaluation for testicle.  For testicle pain/mild will treat with cipro antibiotic. But check area in one week to see if you need US or urine studies. If pain worsens/severe then ED.  Counesled/educated  Follow up in one week or as needed  25 minutes spent with pt. 50% of  time spent counesling on dx, tx, and counseling/education on covid.

## 2018-09-09 ENCOUNTER — Other Ambulatory Visit: Payer: Self-pay | Admitting: *Deleted

## 2018-09-09 ENCOUNTER — Ambulatory Visit: Payer: Self-pay

## 2018-09-09 MED ORDER — CHLORTHALIDONE 25 MG PO TABS: 25.0000 mg | ORAL_TABLET | Freq: Every day | ORAL | 0 refills | Status: DC

## 2018-09-09 NOTE — Telephone Encounter (Signed)
Pt called stating that yesterday his Olmesartan/HCTZ was changed to Olmesartan and Edward saguier added Amlodipine.  He states he has changed because he didn't like the effect of the diuretic. Today he has a headache he describes as "it there but I'm working" His BP this AM 153/110 he took his medication and at 1130 BP 165/106. He called to request to switch back to the Olmesartan/HCTZ. Pt was advised that he would need to speak with the prescriber before changing medications. Care advice read to patient. Pt verbalized understanding of all instructions. Call placed to office. Will rout not to JasnineTorrence CMA per request.  Reason for Disposition . [1] Systolic BP  >= 200 OR Diastolic >= 120  AND [2] having NO cardiac or neurologic symptoms  Answer Assessment - Initial Assessment Questions 1. SYMPTOMS: "Do you have any symptoms?"     headache 2. SEVERITY: If symptoms are present, ask "Are they mild, moderate or severe?"     Moderate but BP is elevated  Answer Assessment - Initial Assessment Questions 1. BLOOD PRESSURE: "What is the blood pressure?" "Did you take at least two measurements 5 minutes apart?"     153/110, 165/106 2. ONSET: "When did you take your blood pressure?"    today 3. HOW: "How did you obtain the blood pressure?" (e.g., visiting nurse, automatic home BP monitor)    Home nmachine 4. HISTORY: "Do you have a history of high blood pressure?"    yes 5. MEDICATIONS: "Are you taking any medications for blood pressure?" "Have you missed any doses recently?"     Medication changed 2 days ago not on the diuretic 6. OTHER SYMPTOMS: "Do you have any symptoms?" (e.g., headache, chest pain, blurred vision, difficulty breathing, weakness)    headache 7. PREGNANCY: "Is there any chance you are pregnant?" "When was your last menstrual period?"     N/A  Protocols used: HIGH BLOOD PRESSURE-A-AH, MEDICATION QUESTION CALL-A-AH

## 2018-09-09 NOTE — Addendum Note (Signed)
Addended by: Gwenevere Abbot on: 09/09/2018 01:39 PM   Modules accepted: Orders

## 2018-09-09 NOTE — Telephone Encounter (Signed)
Take tomorrow at same time he took today.

## 2018-09-09 NOTE — Telephone Encounter (Signed)
Let pt know I sent in higher dose med similar to old diuretic to take over next week. Start today. Continue on meds I rx'd yesterday. Set up visit in one week. Check bp daily until then. If patient has worse signs/symptoms over weekend then advise ED evaluation.   Continue amlodipine and olmasartan. Adding chlorthalidone.

## 2018-09-09 NOTE — Telephone Encounter (Signed)
Notified pt. Pt states  If he starts Chorthalidone tonight does he take it again in the morning when he takes other medication.

## 2018-09-09 NOTE — Telephone Encounter (Signed)
Left pt a message notifying in of message below.

## 2018-09-15 ENCOUNTER — Other Ambulatory Visit: Payer: Self-pay

## 2018-09-15 ENCOUNTER — Encounter: Payer: Self-pay | Admitting: Medical

## 2018-09-15 ENCOUNTER — Other Ambulatory Visit: Payer: Self-pay | Admitting: Medical

## 2018-09-15 ENCOUNTER — Ambulatory Visit: Payer: BLUE CROSS/BLUE SHIELD | Admitting: Medical

## 2018-09-15 ENCOUNTER — Telehealth: Payer: Self-pay | Admitting: Medical

## 2018-09-15 VITALS — BP 120/80 | HR 97 | Temp 98.2°F | Resp 16 | Ht 77.0 in | Wt 375.6 lb

## 2018-09-15 DIAGNOSIS — L918 Other hypertrophic disorders of the skin: Secondary | ICD-10-CM | POA: Diagnosis not present

## 2018-09-15 DIAGNOSIS — L989 Disorder of the skin and subcutaneous tissue, unspecified: Secondary | ICD-10-CM | POA: Diagnosis not present

## 2018-09-15 DIAGNOSIS — I10 Essential (primary) hypertension: Secondary | ICD-10-CM | POA: Diagnosis not present

## 2018-09-15 DIAGNOSIS — N451 Epididymitis: Secondary | ICD-10-CM | POA: Diagnosis not present

## 2018-09-15 LAB — COMPREHENSIVE METABOLIC PANEL
ALT: 27 U/L (ref 0–53)
AST: 19 U/L (ref 0–37)
Albumin: 4.3 g/dL (ref 3.5–5.2)
Alkaline Phosphatase: 63 U/L (ref 39–117)
BUN: 20 mg/dL (ref 6–23)
CO2: 30 mEq/L (ref 19–32)
Calcium: 9.8 mg/dL (ref 8.4–10.5)
Chloride: 103 mEq/L (ref 96–112)
Creatinine, Ser: 1.38 mg/dL (ref 0.40–1.50)
GFR: 70.2 mL/min (ref >60.00)
Glucose, Bld: 109 mg/dL — ABNORMAL HIGH (ref 70–99)
Potassium: 4.1 mEq/L (ref 3.5–5.1)
Sodium: 140 mEq/L (ref 135–145)
Total Bilirubin: 0.5 mg/dL (ref 0.2–1.2)
Total Protein: 7.6 g/dL (ref 6.0–8.3)

## 2018-09-15 LAB — LIPID PANEL
Cholesterol: 205 mg/dL — ABNORMAL HIGH (ref 0–200)
HDL: 33.6 mg/dL — ABNORMAL LOW (ref >39.00)
LDL Cholesterol: 160 mg/dL — ABNORMAL HIGH (ref 0–99)
NonHDL: 171.55
Total CHOL/HDL Ratio: 6
Triglycerides: 56 mg/dL (ref 0.0–149.0)
VLDL: 11.2 mg/dL (ref 0.0–40.0)

## 2018-09-15 MED ORDER — ATORVASTATIN CALCIUM 10 MG PO TABS: ORAL_TABLET | ORAL | 3 refills | Status: DC

## 2018-09-15 NOTE — Progress Notes (Signed)
Subjective:    Patient ID: Wesley Obrien, male    DOB: 1981-03-11, 38 y.o.   MRN: [id number]  HPI  Pt has htn. When he checked his bp this morning and it was better this morning. Reading was 113/85. He states checking bp twice a day at home.pt states bp has been decreasing since I added chlorthaldone. He had wanted to be off hctz due to frequent urination and I had added amlodipine when hctz stopped. But when I made switch his bp increased. So added back chlorthalidone. He states his bp is better now. This morning bp reading less than our reading in office.  Pt also seen last visit for mild occasional left testicle pain left testicle. No groin bulge. Pt states since starting cipro he has not had testicle pain. No urinary symptoms.  Also some skin tags on left side of his neck. Numerous and bothersome. One is large. He wants them removed. Explained procedure to remove.   Review of Systems  Constitutional: Negative for activity change, chills, diaphoresis, fatigue and fever.  Respiratory: Negative for cough, chest tightness and shortness of breath.   Cardiovascular: Negative for chest pain, palpitations and leg swelling.  Gastrointestinal: Negative for abdominal pain, blood in stool, nausea and vomiting.  Genitourinary: Negative for difficulty urinating, flank pain, frequency, hematuria and urgency.  Musculoskeletal: Negative for back pain, joint swelling, neck pain and neck stiffness.  Skin: Negative for rash.       Skin tags/lesion. See hpi.  Neurological: Negative for dizziness and numbness.  Hematological: Negative for adenopathy. Does not bruise/bleed easily.  Psychiatric/Behavioral: Negative for agitation, behavioral problems and confusion. The patient is not nervous/anxious.     Past Medical History:  Diagnosis Date  . History of chicken pox   . Hypertension      Social History   Socioeconomic History  . Marital status: Single    Spouse name: Not on file  . Number of  children: Not on file  . Years of education: Not on file  . Highest education level: Not on file  Occupational History  . Not on file  Social Needs  . Financial resource strain: Not on file  . Food insecurity:    Worry: Not on file    Inability: Not on file  . Transportation needs:    Medical: Not on file    Non-medical: Not on file  Tobacco Use  . Smoking status: Former Smoker    Types: Cigars    Last attempt to quit: 04/16/2016    Years since quitting: 2.4  . Smokeless tobacco: Never Used  Substance and Sexual Activity  . Alcohol use: Yes    Comment: 6 pack 2-3 times a month.  . Drug use: No  . Sexual activity: Yes    Birth control/protection: None  Lifestyle  . Physical activity:    Days per week: Not on file    Minutes per session: Not on file  . Stress: Not on file  Relationships  . Social connections:    Talks on phone: Not on file    Gets together: Not on file    Attends religious service: Not on file    Active member of club or organization: Not on file    Attends meetings of clubs or organizations: Not on file    Relationship status: Not on file  . Intimate partner violence:    Fear of current or ex partner: Not on file    Emotionally abused: Not on file  Physically abused: Not on file    Forced sexual activity: Not on file  Other Topics Concern  . Not on file  Social History Narrative  . Not on file    Past Surgical History:  Procedure Laterality Date  . NO PAST SURGERIES      Family History  Problem Relation Age of Onset  . Healthy Mother   . Diabetes Father   . Hypertension Father   . Healthy Sister   . Healthy Son     Allergies  Allergen Reactions  . Clonidine Derivatives Swelling    Facial swelling.    Current Outpatient Medications on File Prior to Visit  Medication Sig Dispense Refill  . amLODipine (NORVASC) 5 MG tablet Take 1 tablet (5 mg total) by mouth daily. 30 tablet 0  . azelastine (ASTELIN) 0.1 % nasal spray Place 2  sprays into both nostrils 2 (two) times daily. Use in each nostril as directed 30 mL 3  . chlorthalidone (HYGROTON) 25 MG tablet Take 1 tablet (25 mg total) by mouth daily. 7 tablet 0  . ciprofloxacin (CIPRO) 500 MG tablet Take 1 tablet (500 mg total) by mouth 2 (two) times daily. 14 tablet 0  . olmesartan (BENICAR) 40 MG tablet Take 1 tablet (40 mg total) by mouth daily. 90 tablet 1  . olmesartan-hydrochlorothiazide (BENICAR HCT) 40-12.5 MG tablet Take 1 tablet by mouth daily. 30 tablet 5   No current facility-administered medications on file prior to visit.     BP 113/85 Comment: checked at home this am.  Pulse 97   Temp 98.2 F (36.8 C) (Oral)   Resp 16   Ht 6\' 5"  (1.956 m)   Wt (!) 375 lb 9.6 oz (170.4 kg)   SpO2 96%   BMI 44.54 kg/m       Objective:   Physical Exam   General Mental Status- Alert. General Appearance- Not in acute distress.   Skin General: Color- Normal Color. Moisture- Normal Moisture.  Neck Carotid Arteries- Normal color. Moisture- Normal Moisture. No carotid bruits. No JVD.  Chest and Lung Exam Auscultation: Breath Sounds:-Normal.  Cardiovascular Auscultation:Rythm- Regular. Murmurs & Other Heart Sounds:Auscultation of the heart reveals- No Murmurs.  Abdomen Inspection:-Inspeection Normal. Palpation/Percussion:Note:No mass. Palpation and Percussion of the abdomen reveal- Non Tender, Non Distended + BS, no rebound or guarding.    Neurologic Cranial Nerve exam:- CN III-XII intact(No nystagmus), symmetric smile. Strength:- 5/5 equal and symmetric strength both upper and lower extremities.  Skin-  6 skin tages. One moderate large.     Assessment & Plan:  Bp controlled today with current regimen. Continue regimen.  Appears you had probable epididymitis with no current symptoms and negative exam. Likely responded to cipro. Do recommend updating me if pain return. Also recommend do self testicle exam to get baseline. Then recheck periodically  or if pain returns. In that even would get scrotal US. But not indicated presently.  For skin tags, explained procedure and pt signed consent form.  Prepped skin tag/lesion in sterile manner. Used pain ease spray since areas are small. Used silver nitrate to stop bleeding on small skin tags # 7. Largest tag/skin lesion sent for pathology. Bleeding stopped. Pt tolerated procedure well. Counseled area should heal fine. If any signs/symptoms of infection notify us.  Follow up 3 months or as needed  Labs done today as well.  Esperanza RichtersEdward Dailynn Nancarrow, PA-C

## 2018-09-15 NOTE — Telephone Encounter (Signed)
Rx atorvastatin sent to pharmacy,

## 2018-09-15 NOTE — Patient Instructions (Addendum)
Bp controlled today with current regimen. Continue regimen.  Appears you had probable epididymitis with no current symptoms and negative exam. Likely responded to cipro. Do recommend updating me if pain return. Also recommend do self testicle exam to get baseline. Then recheck periodically or if pain returns. In that even would get scrotal US. But not indicated presently.  For skin tags, explained procedure and pt signed consent form.  Prepped skin tag/lesion in sterile manner. Used pain ease spray since areas are small. Used silver nitrate to stop bleeding on small skin tags #7 . Largest tag/skin lesion sent for pathology. Bleeding stopped. Pt tolerated procedure well. Counseled area should heal fine. If any signs/symptoms of infection notify us.  Follow up 3 months or as needed  Labs done today as well.

## 2018-09-19 ENCOUNTER — Encounter: Payer: Self-pay | Admitting: Medical

## 2018-10-11 ENCOUNTER — Other Ambulatory Visit: Payer: Self-pay | Admitting: Medical

## 2018-11-08 ENCOUNTER — Other Ambulatory Visit: Payer: Self-pay | Admitting: Medical

## 2018-12-02 ENCOUNTER — Telehealth: Payer: Self-pay | Admitting: Medical

## 2018-12-02 NOTE — Telephone Encounter (Signed)
Pt request refill  chlorthalidone (HYGROTON) 25 MG tablet  atorvastatin (LIPITOR) 10 MG tablet  amLODipine (NORVASC) 5 MG tablet  olmesartan (BENICAR) 40 MG tablet   Due to insurance purposes, pt needs to use  [place], [place] - [address] [phone] (Phone) [phone] (Fax)

## 2018-12-06 MED ORDER — OLMESARTAN MEDOXOMIL 40 MG PO TABS: 40.0000 mg | ORAL_TABLET | Freq: Every day | ORAL | 1 refills | Status: DC

## 2018-12-06 MED ORDER — AMLODIPINE BESYLATE 5 MG PO TABS: ORAL_TABLET | ORAL | 1 refills | Status: DC

## 2018-12-06 MED ORDER — ATORVASTATIN CALCIUM 10 MG PO TABS: ORAL_TABLET | ORAL | 1 refills | Status: DC

## 2018-12-06 MED ORDER — CHLORTHALIDONE 25 MG PO TABS: 25.0000 mg | ORAL_TABLET | Freq: Every day | ORAL | 0 refills | Status: DC

## 2018-12-06 NOTE — Telephone Encounter (Signed)
Rx sent to pharmacy   

## 2018-12-25 ENCOUNTER — Encounter: Payer: Self-pay | Admitting: Medical

## 2018-12-27 ENCOUNTER — Telehealth: Payer: Self-pay | Admitting: Medical

## 2018-12-27 NOTE — Telephone Encounter (Signed)
Pt needs to know his blood type and would like a call to get this information or if this is not available he would like to know how he should go about getting his blood type/ please advise
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
## 2018-12-29 NOTE — Telephone Encounter (Signed)
See mychart messages

## 2019-02-06 ENCOUNTER — Ambulatory Visit: Payer: BC Managed Care – PPO | Admitting: Medical

## 2019-02-07 ENCOUNTER — Encounter: Payer: Self-pay | Admitting: Medical

## 2019-02-07 ENCOUNTER — Ambulatory Visit (INDEPENDENT_AMBULATORY_CARE_PROVIDER_SITE_OTHER): Payer: BC Managed Care – PPO | Admitting: Medical

## 2019-02-07 ENCOUNTER — Other Ambulatory Visit: Payer: Self-pay

## 2019-02-07 VITALS — BP 118/75 | HR 88 | Temp 97.3°F | Resp 16 | Ht 72.0 in | Wt 379.0 lb

## 2019-02-07 DIAGNOSIS — I10 Essential (primary) hypertension: Secondary | ICD-10-CM

## 2019-02-07 DIAGNOSIS — E7849 Other hyperlipidemia: Secondary | ICD-10-CM | POA: Diagnosis not present

## 2019-02-07 DIAGNOSIS — R5383 Other fatigue: Secondary | ICD-10-CM | POA: Diagnosis not present

## 2019-02-07 DIAGNOSIS — E669 Obesity, unspecified: Secondary | ICD-10-CM

## 2019-02-07 LAB — LIPID PANEL
Cholesterol: 143 mg/dL (ref 0–200)
HDL: 33.1 mg/dL — ABNORMAL LOW (ref >39.00)
LDL Cholesterol: 97 mg/dL (ref 0–99)
NonHDL: 110.32
Total CHOL/HDL Ratio: 4
Triglycerides: 66 mg/dL (ref 0.0–149.0)
VLDL: 13.2 mg/dL (ref 0.0–40.0)

## 2019-02-07 LAB — CBC WITH DIFFERENTIAL/PLATELET
Basophils Absolute: 0.1 10*3/uL (ref 0.0–0.1)
Basophils Relative: 0.8 % (ref 0.0–3.0)
Eosinophils Absolute: 0.1 10*3/uL (ref 0.0–0.7)
Eosinophils Relative: 1.3 % (ref 0.0–5.0)
HCT: 42.9 % (ref 39.0–52.0)
Hemoglobin: 13.8 g/dL (ref 13.0–17.0)
Lymphocytes Relative: 17.7 % (ref 12.0–46.0)
Lymphs Abs: 1.3 10*3/uL (ref 0.7–4.0)
MCHC: 32.2 g/dL (ref 30.0–36.0)
MCV: 89.4 fl (ref 78.0–100.0)
Monocytes Absolute: 0.6 10*3/uL (ref 0.1–1.0)
Monocytes Relative: 8 % (ref 3.0–12.0)
Neutro Abs: 5.5 10*3/uL (ref 1.4–7.7)
Neutrophils Relative %: 72.2 % (ref 43.0–77.0)
Platelets: 235 10*3/uL (ref 150.0–400.0)
RBC: 4.8 Mil/uL (ref 4.22–5.81)
RDW: 13.1 % (ref 11.5–15.5)
WBC: 7.6 10*3/uL (ref 4.0–10.5)

## 2019-02-07 LAB — VITAMIN B12: Vitamin B-12: 445 pg/mL (ref 211–911)

## 2019-02-07 LAB — COMPREHENSIVE METABOLIC PANEL
ALT: 22 U/L (ref 0–53)
AST: 17 U/L (ref 0–37)
Albumin: 4.2 g/dL (ref 3.5–5.2)
Alkaline Phosphatase: 65 U/L (ref 39–117)
BUN: 13 mg/dL (ref 6–23)
CO2: 31 mEq/L (ref 19–32)
Calcium: 9.2 mg/dL (ref 8.4–10.5)
Chloride: 100 mEq/L (ref 96–112)
Creatinine, Ser: 1.23 mg/dL (ref 0.40–1.50)
GFR: 80 mL/min (ref >60.00)
Glucose, Bld: 134 mg/dL — ABNORMAL HIGH (ref 70–99)
Potassium: 4.2 mEq/L (ref 3.5–5.1)
Sodium: 137 mEq/L (ref 135–145)
Total Bilirubin: 0.7 mg/dL (ref 0.2–1.2)
Total Protein: 7.2 g/dL (ref 6.0–8.3)

## 2019-02-07 LAB — TSH: TSH: 1.72 u[IU]/mL (ref 0.35–4.50)

## 2019-02-07 NOTE — Progress Notes (Signed)
Subjective:    Patient ID: Wesley Obrien, male    DOB: 09/14/1980, 38 y.o.   MRN: [id number]  HPI  Pt in for follow up.  Pt states overall doing well.   Pt has htn. Pt has no cardiac or neurologic signs or symptoms.He does not check his bp often but when he does check rarely he got 120/86, 118/80 and 119/86. His bp is slightly higher.   Pt has some high cholesterol and was high 4 months ago.  Pt has been exercising and trying to lose weight. He has been working out 3-4 days a week. Walking 30 minutes to 1 hour. Over last 2 years increase weight of 20 lbs.   Pt declines flu vaccine today.   Review of Systems  Constitutional: Positive for fatigue. Negative for chills and fever.       He reports some fatigue.  HENT: Negative for congestion, ear pain, sinus pressure, sinus pain and sore throat.   Respiratory: Negative for cough, chest tightness, shortness of breath and wheezing.   Cardiovascular: Negative for chest pain and palpitations.  Gastrointestinal: Negative for abdominal pain, constipation and nausea.  Genitourinary: Negative for dysuria.  Musculoskeletal: Negative for back pain.  Skin: Negative for rash.  Neurological: Negative for dizziness, weakness and headaches.  Hematological: Negative for adenopathy. Does not bruise/bleed easily.  Psychiatric/Behavioral: Negative for behavioral problems and confusion.    Past Medical History:  Diagnosis Date  . History of chicken pox   . Hypertension      Social History   Socioeconomic History  . Marital status: Single    Spouse name: Not on file  . Number of children: Not on file  . Years of education: Not on file  . Highest education level: Not on file  Occupational History  . Not on file  Social Needs  . Financial resource strain: Not on file  . Food insecurity    Worry: Not on file    Inability: Not on file  . Transportation needs    Medical: Not on file    Non-medical: Not on file  Tobacco Use  . Smoking  status: Former Smoker    Types: Cigars    Quit date: 04/16/2016    Years since quitting: 2.8  . Smokeless tobacco: Never Used  Substance and Sexual Activity  . Alcohol use: Yes    Comment: 6 pack 2-3 times a month.  . Drug use: No  . Sexual activity: Yes    Birth control/protection: None  Lifestyle  . Physical activity    Days per week: Not on file    Minutes per session: Not on file  . Stress: Not on file  Relationships  . Social Musician on phone: Not on file    Gets together: Not on file    Attends religious service: Not on file    Active member of club or organization: Not on file    Attends meetings of clubs or organizations: Not on file    Relationship status: Not on file  . Intimate partner violence    Fear of current or ex partner: Not on file    Emotionally abused: Not on file    Physically abused: Not on file    Forced sexual activity: Not on file  Other Topics Concern  . Not on file  Social History Narrative  . Not on file    Past Surgical History:  Procedure Laterality Date  . NO PAST SURGERIES  Family History  Problem Relation Age of Onset  . Healthy Mother   . Diabetes Father   . Hypertension Father   . Healthy Sister   . Healthy Son     Allergies  Allergen Reactions  . Clonidine Derivatives Swelling    Facial swelling.    Current Outpatient Medications on File Prior to Visit  Medication Sig Dispense Refill  . amLODipine (NORVASC) 5 MG tablet TAKE 1 TABLET(5 MG) BY MOUTH DAILY 90 tablet 1  . atorvastatin (LIPITOR) 10 MG tablet 1 tab po q day 90 tablet 1  . azelastine (ASTELIN) 0.1 % nasal spray Place 2 sprays into both nostrils 2 (two) times daily. Use in each nostril as directed 30 mL 3  . chlorthalidone (HYGROTON) 25 MG tablet Take 1 tablet (25 mg total) by mouth daily. 90 tablet 0  . ciprofloxacin (CIPRO) 500 MG tablet Take 1 tablet (500 mg total) by mouth 2 (two) times daily. 14 tablet 0  . olmesartan (BENICAR) 40 MG  tablet Take 1 tablet (40 mg total) by mouth daily. 90 tablet 1  . olmesartan-hydrochlorothiazide (BENICAR HCT) 40-12.5 MG tablet Take 1 tablet by mouth daily. 30 tablet 5   No current facility-administered medications on file prior to visit.     BP 127/87   Pulse 88   Temp (!) 97.3 F (36.3 C) (Temporal)   Resp 16   Ht 6\' 5"  (1.956 m)   Wt (!) 379 lb (171.9 kg)   SpO2 98%   BMI 44.94 kg/m       Objective:   Physical Exam  General Mental Status- Alert. General Appearance- Not in acute distress.   Skin General: Color- Normal Color. Moisture- Normal Moisture.  Neck Carotid Arteries- Normal color. Moisture- Normal Moisture. No carotid bruits. No JVD.  Chest and Lung Exam Auscultation: Breath Sounds:-Normal.  Cardiovascular Auscultation:Rythm- Regular. Murmurs & Other Heart Sounds:Auscultation of the heart reveals- No Murmurs.  Abdomen Inspection:-Inspeection Normal. Palpation/Percussion:Note:No mass. Palpation and Percussion of the abdomen reveal- Non Tender, Non Distended + BS, no rebound or guarding.   Neurologic Cranial Nerve exam:- CN III-XII intact(No nystagmus), symmetric smile. Strength:- 5/5 equal and symmetric strength both upper and lower extremities.      Assessment & Plan:  Your bp is well controlled today. Continue current bp medicines.  For high cholesterol, continue atorvastatin and will get cmp and lipid panel today.  For fatigue will get cbc, tsh, b1, and b12.  For obesity, I recommend that you continue diet such as weight watchers and exercise.  Follow up date to be determined after lab review.  40 minutes spent with pt. 50% of time spent counseling pt on plan going forward. A lot of time spent on counseling on diet, weight loss  and answering pt questions.  Mackie Pai, PA-C

## 2019-02-07 NOTE — Patient Instructions (Addendum)
Your bp is well controlled today. Continue current bp medicines.  For high cholesterol, continue atorvastatin and will get cmp and lipid panel today.  For fatigue will get cbc, tsh, b1, and b12.  For obesity, I recommend that you continue diet such as weight watchers and exercise.  Follow up date to be determined after lab review.

## 2019-02-08 ENCOUNTER — Other Ambulatory Visit (INDEPENDENT_AMBULATORY_CARE_PROVIDER_SITE_OTHER): Payer: BC Managed Care – PPO

## 2019-02-08 DIAGNOSIS — R7309 Other abnormal glucose: Secondary | ICD-10-CM

## 2019-02-08 LAB — HEMOGLOBIN A1C: Hgb A1c MFr Bld: 6.2 % (ref 4.6–6.5)

## 2019-02-11 LAB — VITAMIN B1: Vitamin B1 (Thiamine): 10 nmol/L (ref 8–30)

## 2019-03-31 ENCOUNTER — Telehealth: Payer: Self-pay | Admitting: Medical

## 2019-04-04 ENCOUNTER — Other Ambulatory Visit: Payer: Self-pay | Admitting: Medical

## 2019-04-04 MED ORDER — AMLODIPINE BESYLATE 5 MG PO TABS: ORAL_TABLET | ORAL | 1 refills | Status: DC

## 2019-04-04 MED ORDER — ATORVASTATIN CALCIUM 10 MG PO TABS: ORAL_TABLET | ORAL | 1 refills | Status: DC

## 2019-04-04 MED ORDER — OLMESARTAN MEDOXOMIL 40 MG PO TABS: 40.0000 mg | ORAL_TABLET | Freq: Every day | ORAL | 1 refills | Status: DC

## 2019-04-04 NOTE — Telephone Encounter (Signed)
Pt called in and stated that [facility] not rec [facility] and would like to know if this can be resent

## 2019-04-04 NOTE — Telephone Encounter (Signed)
Medication Refill - Medication: olmesartan (BENICAR) 40 MG tablet /atorvastatin (LIPITOR) 10 MG tablet /amLODipine (NORVASC) 5 MG tablet   Has the patient contacted their pharmacy? Yes.   (Agent: If no, request that the patient contact the pharmacy for the refill.) (Agent: If yes, when and what did the pharmacy advise?)  Preferred Pharmacy (with phone number or street name):  [facility] - [address], [place] Phone:  [phone]  Fax:  [phone]       Agent: Please be advised that RX refills may take up to 3 business days. We ask that you follow-up with your pharmacy.

## 2019-04-05 NOTE — Telephone Encounter (Signed)
[facility]ed [facility] and they stated that order was in process and due to be shipped out soon.    Called and notified patient of this.  He stated that he has not ran out yet.  Advised that if he does not get his shipment when he runs out to call insurance to see if he can get override and call us to get a refill to send in to local pharmacy.

## 2019-12-03 ENCOUNTER — Other Ambulatory Visit: Payer: Self-pay | Admitting: Medical

## 2020-03-07 ENCOUNTER — Other Ambulatory Visit: Payer: Self-pay | Admitting: Medical

## 2020-03-07 ENCOUNTER — Telehealth: Payer: Self-pay | Admitting: Medical

## 2020-03-07 DIAGNOSIS — N469 Male infertility, unspecified: Secondary | ICD-10-CM

## 2020-03-07 NOTE — Telephone Encounter (Signed)
Refer to urologist placed.

## 2020-05-20 ENCOUNTER — Other Ambulatory Visit: Payer: Self-pay | Admitting: Internal Medicine

## 2020-05-21 LAB — COMPLETE METABOLIC PANEL WITH GFR
AG Ratio: 1.4 (calc) (ref 1.0–2.5)
ALT: 20 U/L (ref 9–46)
AST: 16 U/L (ref 10–40)
Albumin: 4.2 g/dL (ref 3.6–5.1)
Alkaline phosphatase (APISO): 77 U/L (ref 36–130)
BUN/Creatinine Ratio: 13 (calc) (ref 6–22)
BUN: 19 mg/dL (ref 7–25)
CO2: 27 mmol/L (ref 20–32)
Calcium: 9.6 mg/dL (ref 8.6–10.3)
Chloride: 103 mmol/L (ref 98–110)
Creat: 1.42 mg/dL — ABNORMAL HIGH (ref 0.60–1.35)
GFR, Est African American: 72 mL/min/{1.73_m2} (ref >=60)
GFR, Est Non African American: 62 mL/min/{1.73_m2} (ref >=60)
Globulin: 3 g/dL (calc) (ref 1.9–3.7)
Glucose, Bld: 82 mg/dL (ref 65–99)
Potassium: 4.3 mmol/L (ref 3.5–5.3)
Sodium: 140 mmol/L (ref 135–146)
Total Bilirubin: 0.5 mg/dL (ref 0.2–1.2)
Total Protein: 7.2 g/dL (ref 6.1–8.1)

## 2020-05-21 LAB — LIPID PANEL
Cholesterol: 176 mg/dL (ref <200)
HDL: 43 mg/dL (ref >=40)
LDL Cholesterol (Calc): 108 mg/dL (calc) — ABNORMAL HIGH
Non-HDL Cholesterol (Calc): 133 mg/dL (calc) — ABNORMAL HIGH (ref <130)
Total CHOL/HDL Ratio: 4.1 (calc) (ref <5.0)
Triglycerides: 137 mg/dL (ref <150)

## 2020-05-21 LAB — CBC
HCT: 43.6 % (ref 38.5–50.0)
Hemoglobin: 14.5 g/dL (ref 13.2–17.1)
MCH: 28.8 pg (ref 27.0–33.0)
MCHC: 33.3 g/dL (ref 32.0–36.0)
MCV: 86.7 fL (ref 80.0–100.0)
MPV: 13.4 fL — ABNORMAL HIGH (ref 7.5–12.5)
Platelets: 214 10*3/uL (ref 140–400)
RBC: 5.03 10*6/uL (ref 4.20–5.80)
RDW: 12.8 % (ref 11.0–15.0)
WBC: 6.7 10*3/uL (ref 3.8–10.8)

## 2020-05-21 LAB — VITAMIN D 25 HYDROXY (VIT D DEFICIENCY, FRACTURES): Vit D, 25-Hydroxy: 37 ng/mL (ref 30–100)

## 2020-05-21 LAB — TSH: TSH: 2.36 mIU/L (ref 0.40–4.50)

## 2020-11-27 ENCOUNTER — Other Ambulatory Visit: Payer: Self-pay | Admitting: Medical

## 2021-01-12 ENCOUNTER — Encounter (HOSPITAL_BASED_OUTPATIENT_CLINIC_OR_DEPARTMENT_OTHER): Payer: Self-pay | Admitting: Emergency Medicine

## 2021-01-12 ENCOUNTER — Other Ambulatory Visit: Payer: Self-pay

## 2021-01-12 DIAGNOSIS — Z87891 Personal history of nicotine dependence: Secondary | ICD-10-CM | POA: Insufficient documentation

## 2021-01-12 DIAGNOSIS — Z79899 Other long term (current) drug therapy: Secondary | ICD-10-CM | POA: Insufficient documentation

## 2021-01-12 DIAGNOSIS — R110 Nausea: Secondary | ICD-10-CM | POA: Insufficient documentation

## 2021-01-12 DIAGNOSIS — R42 Dizziness and giddiness: Secondary | ICD-10-CM | POA: Insufficient documentation

## 2021-01-12 DIAGNOSIS — I10 Essential (primary) hypertension: Secondary | ICD-10-CM | POA: Insufficient documentation

## 2021-01-12 LAB — CBG MONITORING, ED: Glucose-Capillary: 114 mg/dL — ABNORMAL HIGH (ref 70–99)

## 2021-01-12 NOTE — ED Triage Notes (Signed)
Pt reports new onset of dizziness Friday night after drinking more energy drinks at work than he normally does.  Pt reports feeling of dizziness has come and gone since but persists.  Pt reports mild nausea as well.

## 2021-01-13 ENCOUNTER — Emergency Department (HOSPITAL_BASED_OUTPATIENT_CLINIC_OR_DEPARTMENT_OTHER)
Admission: EM | Admit: 2021-01-13 | Discharge: 2021-01-13 | Disposition: A | Discharge status: Home / Self Care | Payer: BC Managed Care – PPO | Attending: Emergency Medicine | Admitting: Emergency Medicine

## 2021-01-13 DIAGNOSIS — R42 Dizziness and giddiness: Secondary | ICD-10-CM

## 2021-01-13 LAB — CBC WITH DIFFERENTIAL/PLATELET
Abs Immature Granulocytes: 0 10*3/uL (ref 0.00–0.07)
Basophils Absolute: 0 10*3/uL (ref 0.0–0.1)
Basophils Relative: 0 %
Eosinophils Absolute: 0.2 10*3/uL (ref 0.0–0.5)
Eosinophils Relative: 2 %
HCT: 45.9 % (ref 39.0–52.0)
Hemoglobin: 14.6 g/dL (ref 13.0–17.0)
Immature Granulocytes: 0 %
Lymphocytes Relative: 25 %
Lymphs Abs: 2.2 10*3/uL (ref 0.7–4.0)
MCH: 27.7 pg (ref 26.0–34.0)
MCHC: 31.8 g/dL (ref 30.0–36.0)
MCV: 87.1 fL (ref 80.0–100.0)
Monocytes Absolute: 0.8 10*3/uL (ref 0.1–1.0)
Monocytes Relative: 9 %
Neutro Abs: 5.8 10*3/uL (ref 1.7–7.7)
Neutrophils Relative %: 64 %
Platelets: 213 10*3/uL (ref 150–400)
RBC: 5.27 MIL/uL (ref 4.22–5.81)
RDW: 13.1 % (ref 11.5–15.5)
WBC: 9 10*3/uL (ref 4.0–10.5)
nRBC: 0 % (ref 0.0–0.2)

## 2021-01-13 LAB — COMPREHENSIVE METABOLIC PANEL
ALT: 20 U/L (ref 0–44)
AST: 15 U/L (ref 15–41)
Albumin: 3.9 g/dL (ref 3.5–5.0)
Alkaline Phosphatase: 58 U/L (ref 38–126)
Anion gap: 6 (ref 5–15)
BUN: 14 mg/dL (ref 6–20)
CO2: 30 mmol/L (ref 22–32)
Calcium: 9 mg/dL (ref 8.9–10.3)
Chloride: 104 mmol/L (ref 98–111)
Creatinine, Ser: 1.29 mg/dL — ABNORMAL HIGH (ref 0.61–1.24)
GFR, Estimated: >60 mL/min (ref >60)
Glucose, Bld: 99 mg/dL (ref 70–99)
Potassium: 3.6 mmol/L (ref 3.5–5.1)
Sodium: 140 mmol/L (ref 135–145)
Total Bilirubin: 0.5 mg/dL (ref 0.3–1.2)
Total Protein: 7.1 g/dL (ref 6.5–8.1)

## 2021-01-13 LAB — LIPASE, BLOOD: Lipase: 14 U/L (ref 11–51)

## 2021-01-13 MED ORDER — SODIUM CHLORIDE 0.9 % IV BOLUS
1000.0000 mL | Freq: Once | INTRAVENOUS | Status: AC
  Administered 2021-01-13: 1000 mL via INTRAVENOUS

## 2021-01-13 NOTE — Discharge Instructions (Addendum)
You were seen today for dizziness.  This is likely multifactorial and a combination of dehydration and increased caffeine use.  Make sure that you are staying well-hydrated.  Minimize caffeine intake.  Your blood pressure was noted to be slightly elevated.  Make sure you are taking your blood pressure medications as directed.

## 2021-01-13 NOTE — ED Provider Notes (Addendum)
[facility] EMERGENCY DEPT Provider Note   CSN: [id number] Arrival date & time: 01/12/21  2135     History Chief Complaint  Patient presents with   Dizziness    Wesley Obrien is a 40 y.o. male.  HPI     This is a 40 year old male with history of hypertension who presents with dizziness.  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  He states that he drank several cups of coffee and several monster energy drinks.  This is significantly more caffeine than normal for him.  He describes lightheadedness mostly when he goes from a sitting to standing position.  No true room spinning.  No worsening with range of motion of his head.  He has had some nausea but no vomiting.  No chest pain, shortness of breath.  Patient does think that he may be a little dehydrated as well.  He states overall the symptoms have waxed and waned.  He describes it mostly as "like I am drunk but have not had any alcohol."  Past Medical History:  Diagnosis Date   History of chicken pox    Hypertension     There are no problems to display for this patient.   Past Surgical History:  Procedure Laterality Date   NO PAST SURGERIES         Family History  Problem Relation Age of Onset   Healthy Mother    Diabetes Father    Hypertension Father    Healthy Sister    Healthy Son     Social History   Tobacco Use   Smoking status: Former    Types: Cigars    Quit date: 04/16/2016    Years since quitting: 4.7   Smokeless tobacco: Never  Vaping Use   Vaping Use: Some days  Substance Use Topics   Alcohol use: Yes    Comment: 6 pack 2-3 times a month.   Drug use: No    Home Medications Prior to Admission medications   Medication Sig Start Date End Date Taking? Authorizing Provider  amLODipine (NORVASC) 5 MG tablet TAKE 1 TABLET DAILY 11/27/20   Saguier, Ramon Dredge, PA-C  atorvastatin (LIPITOR) 10 MG tablet  TAKE 1 TABLET DAILY 11/27/20   Saguier, Ramon Dredge, PA-C  azelastine (ASTELIN) 0.1 % nasal spray Place 2 sprays into both nostrils 2 (two) times daily. Use in each nostril as directed 08/30/17   Saguier, Ramon Dredge, PA-C  chlorthalidone (HYGROTON) 25 MG tablet TAKE 1 TABLET DAILY 03/07/20   Saguier, Ramon Dredge, PA-C  ciprofloxacin (CIPRO) 500 MG tablet Take 1 tablet (500 mg total) by mouth 2 (two) times daily. 09/08/18   Saguier, Ramon Dredge, PA-C  olmesartan (BENICAR) 40 MG tablet TAKE 1 TABLET DAILY 11/27/20   Saguier, Ramon Dredge, PA-C  olmesartan-hydrochlorothiazide (BENICAR HCT) 40-12.5 MG tablet Take 1 tablet by mouth daily. 09/02/18   Saguier, Ramon Dredge, PA-C    Allergies    Clonidine derivatives  Review of Systems   Review of Systems  Constitutional:  Negative for fever.  Respiratory:  Negative for shortness of breath.   Cardiovascular:  Negative for chest pain.  Gastrointestinal:  Negative for abdominal pain.  Genitourinary:  Negative for dysuria.  Neurological:  Positive for dizziness and light-headedness. Negative for headaches.  All other systems reviewed and are negative.  Physical Exam Updated Vital Signs BP (!) 152/112   Pulse 76   Temp 98.8 F (37.1 C) (Oral)  Resp 19   Ht 1.854 m (6\' 1" )   Wt (!) 158.8 kg   SpO2 98%   BMI 46.18 kg/m   Physical Exam Vitals and nursing note reviewed.  Constitutional:      Appearance: He is well-developed. He is obese. He is not ill-appearing.  HENT:     Head: Normocephalic and atraumatic.     Nose: Nose normal.     Mouth/Throat:     Mouth: Mucous membranes are moist.  Eyes:     Extraocular Movements: Extraocular movements intact.     Pupils: Pupils are equal, round, and reactive to light.  Cardiovascular:     Rate and Rhythm: Normal rate and regular rhythm.     Heart sounds: Normal heart sounds. No murmur heard. Pulmonary:     Effort: Pulmonary effort is normal. No respiratory distress.     Breath sounds: Normal breath sounds. No wheezing.   Abdominal:     General: Bowel sounds are normal.     Palpations: Abdomen is soft.     Tenderness: There is no abdominal tenderness. There is no rebound.  Musculoskeletal:     Cervical back: Neck supple.  Lymphadenopathy:     Cervical: No cervical adenopathy.  Skin:    General: Skin is warm and dry.  Neurological:     Mental Status: He is alert and oriented to person, place, and time.     Comments: Cranial nerves II through XII intact, 5 out of 5 strength in all 4 extremities, no dysmetria to finger-nose-finger  Psychiatric:        Mood and Affect: Mood normal.    ED Results / Procedures / Treatments   Labs (all labs ordered are listed, but only abnormal results are displayed) Labs Reviewed  COMPREHENSIVE METABOLIC PANEL - Abnormal; Notable for the following components:      Result Value   Creatinine, Ser 1.29 (*)    All other components within normal limits  CBG MONITORING, ED - Abnormal; Notable for the following components:   Glucose-Capillary 114 (*)    All other components within normal limits  CBC WITH DIFFERENTIAL/PLATELET  LIPASE, BLOOD    EKG EKG Interpretation  Date/Time:  Sunday January 12 2021 21:55:49 EDT Ventricular Rate:  83 PR Interval:  154 QRS Duration: 96 QT Interval:  396 QTC Calculation: 465 R Axis:   79 Text Interpretation: Normal sinus rhythm Normal ECG Confirmed by 12-04-1994 (Ross Marcus) on 01/13/2021 1:38:09 AM  Radiology No results found.  Procedures Procedures   Medications Ordered in ED Medications  sodium chloride 0.9 % bolus 1,000 mL (1,000 mLs Intravenous New Bag/Given 01/13/21 0057)    ED Course  I have reviewed the triage vital signs and the nursing notes.  Pertinent labs & imaging results that were available during my care of the patient were reviewed by me and considered in my medical decision making (see chart for details).    MDM Rules/Calculators/A&P                           Patient presents with dizziness.  He  is overall nontoxic.  Vital signs are notable initially for blood pressure 167/127.  He has a history of hypertension.  Blood pressure improved to 152/112.  He describes mostly lightheadedness with position changes.  His neurologic exam is normal.  No signs of cerebellar dysfunction.  No ataxia on gait testing.  Would suspect potential dehydration versus increased caffeine use as contributing to  his dizziness.  Less likely central cause such as stroke given his normal neurologic exam.  While he is hypertensive, there are no signs of hypertensive urgency or emergency.  Patient was given fluids.  Orthostatics were taken after fluid resuscitation.  They are now normal.  Patient states that he feels overall improved.  His labs are reassuring.  He has no significant metabolic derangements.  EKG shows no evidence of acute ischemic or arrhythmic changes.  Discussed results with the patient.  He states he feels mostly back to baseline.  Recommend that he stays hydrated and limits caffeine intake.  After history, exam, and medical workup I feel the patient has been appropriately medically screened and is safe for discharge home. Pertinent diagnoses were discussed with the patient. Patient was given return precautions.  Final Clinical Impression(s) / ED Diagnoses Final diagnoses:  Dizziness    Rx / DC Orders ED Discharge Orders     None        Cornie Herrington, Mayer Masker, MD 01/13/21 9833    Shon Baton, MD 01/13/21 [phone]

## 2021-01-13 NOTE — ED Notes (Signed)
Pt is laying flat for orthostatics

## 2021-03-12 ENCOUNTER — Other Ambulatory Visit: Payer: Self-pay

## 2021-03-12 ENCOUNTER — Ambulatory Visit (INDEPENDENT_AMBULATORY_CARE_PROVIDER_SITE_OTHER): Payer: BC Managed Care – PPO | Admitting: Medical

## 2021-03-12 ENCOUNTER — Encounter: Payer: Self-pay | Admitting: Medical

## 2021-03-12 VITALS — BP 160/90 | HR 88 | Temp 97.8°F | Resp 18 | Ht 73.0 in | Wt 364.0 lb

## 2021-03-12 DIAGNOSIS — I10 Essential (primary) hypertension: Secondary | ICD-10-CM | POA: Diagnosis not present

## 2021-03-12 MED ORDER — AMLODIPINE BESYLATE 10 MG PO TABS: 10.0000 mg | ORAL_TABLET | Freq: Every day | ORAL | 0 refills | Status: DC

## 2021-03-12 MED ORDER — OLMESARTAN MEDOXOMIL 40 MG PO TABS: 40.0000 mg | ORAL_TABLET | Freq: Every day | ORAL | 3 refills | Status: DC

## 2021-03-12 MED ORDER — CHLORTHALIDONE 25 MG PO TABS: 25.0000 mg | ORAL_TABLET | Freq: Every day | ORAL | 0 refills | Status: DC

## 2021-03-12 NOTE — Patient Instructions (Addendum)
Htn high today when I checked 150-160/90. Initially reading 160/120. I doubt accuracy of machine initial check.  Described last provider had decreased some of former meds.  Have you get back on former regimen almosartan,  amlodipine 10 mg and chlorthalidone 25 mg daily. Refilling meds today  Future cmp and lipid panel placed. Lab  already closed. Get scheduled for Monday.   Ask you follow with me next Friday.   If you blood pressure spike above 160/90 or any cardiac/neuro symptoms then be seen in the ED.

## 2021-03-12 NOTE — Addendum Note (Signed)
Addended by: Gwenevere Abbot on: 03/12/2021 04:59 PM   Modules accepted: Orders

## 2021-03-12 NOTE — Progress Notes (Addendum)
Subjective:    Patient ID: Wesley Obrien, male    DOB: Oct 06, 1980, 40 y.o.   MRN: [id number]  HPI Last time I saw pt was 02-07-19.  His bp was very high initially today. No cardiac or neurologic signs or symptoms.  Pt states he had been to other provider who reduced his dosages. He got new job and he gained some wt.  Pt in the past was on olmarsartin, amlodipine and chlorthaldine.   Since I last saw pt he state one MD he saw decreased his meds.  Pt states last 2-3 weeks he restarted olmasartan 40 mg daily.   Has hyperlipidemia.  Pt did not check his bp today. Last time checked was one week ago but can't remember the reading.  Pt states he was very nervous when first got here  Review of Systems  Constitutional:  Negative for chills, fatigue and fever.  Respiratory:  Negative for cough, chest tightness, shortness of breath and wheezing.   Cardiovascular:  Negative for chest pain and palpitations.  Gastrointestinal:  Negative for abdominal pain, constipation, nausea and vomiting.  Musculoskeletal:  Negative for back pain, neck pain and neck stiffness.  Skin:  Negative for rash.  Neurological:  Negative for dizziness, seizures, speech difficulty, weakness, light-headedness and headaches.  Psychiatric/Behavioral:  Negative for behavioral problems and confusion.   Past Medical History:  Diagnosis Date   History of chicken pox    Hypertension      Social History   Socioeconomic History   Marital status: Single    Spouse name: Not on file   Number of children: Not on file   Years of education: Not on file   Highest education level: Not on file  Occupational History   Not on file  Tobacco Use   Smoking status: Former    Types: Cigars    Quit date: 04/16/2016    Years since quitting: 4.9   Smokeless tobacco: Never  Vaping Use   Vaping Use: Some days  Substance and Sexual Activity   Alcohol use: Yes    Comment: 6 pack 2-3 times a month.   Drug use: No   Sexual  activity: Yes    Birth control/protection: None  Other Topics Concern   Not on file  Social History Narrative   Not on file   Social Determinants of Health   Financial Resource Strain: Not on file  Food Insecurity: Not on file  Transportation Needs: Not on file  Physical Activity: Not on file  Stress: Not on file  Social Connections: Not on file  Intimate Partner Violence: Not on file    Past Surgical History:  Procedure Laterality Date   NO PAST SURGERIES      Family History  Problem Relation Age of Onset   Healthy Mother    Diabetes Father    Hypertension Father    Healthy Sister    Healthy Son     Allergies  Allergen Reactions   Clonidine Derivatives Swelling    Facial swelling.    Current Outpatient Medications on File Prior to Visit  Medication Sig Dispense Refill   olmesartan (BENICAR) 40 MG tablet TAKE 1 TABLET DAILY 90 tablet 3   No current facility-administered medications on file prior to visit.    BP (!) 161/120   Pulse 88   Temp 97.8 F (36.6 C)   Resp 18   Ht 6\' 1"  (1.854 m)   Wt (!) 364 lb (165.1 kg)   SpO2 96%  BMI 48.02 kg/m   160/9 espac    Objective:   Physical Exam  General Mental Status- Alert. General Appearance- Not in acute distress.   Skin General: Color- Normal Color. Moisture- Normal Moisture.  Neck Carotid Arteries- Normal color. Moisture- Normal Moisture. No carotid bruits. No JVD.  Chest and Lung Exam Auscultation: Breath Sounds:-Normal.  Cardiovascular Auscultation:Rythm- Regular. Murmurs & Other Heart Sounds:Auscultation of the heart reveals- No Murmurs.  Abdomen Inspection:-Inspeection Normal. Palpation/Percussion:Note:No mass. Palpation and Percussion of the abdomen reveal- Non Tender, Non Distended + BS, no rebound or guarding.   Neurologic Cranial Nerve exam:- CN III-XII intact(No nystagmus), symmetric smile. Drift Test:- No drift. Romberg Exam:- Negative.  Heal to Toe Gait exam:-Normal. Finger  to Nose:- Normal/Intact Strength:- 5/5 equal and symmetric strength both upper and lower extremities.       Assessment & Plan:   Patient Instructions  Htn high today when I checked 150-160/90. Initially reading 160/120. I doubt accuracy of machine initial check.  Described last provider had decreased some of former meds.  Have you get back on former regimen almosartan,  amlodipoine 10 mg and cholrthalidone 25 mg daily. Refilling meds today  Future cmp and lipid panel placed. Lab  already closed. Get scheduled for Monday.   Ask you follow with me next Friday.   If you blood pressure spike above 160/90 or any cardiac/neuro symptoms then be seen in the ED.    Esperanza Richters, PA-C

## 2021-03-17 ENCOUNTER — Other Ambulatory Visit: Payer: Self-pay

## 2021-03-17 ENCOUNTER — Other Ambulatory Visit (INDEPENDENT_AMBULATORY_CARE_PROVIDER_SITE_OTHER): Payer: BC Managed Care – PPO

## 2021-03-17 DIAGNOSIS — I10 Essential (primary) hypertension: Secondary | ICD-10-CM | POA: Diagnosis not present

## 2021-03-17 LAB — LIPID PANEL
Cholesterol: 236 mg/dL — ABNORMAL HIGH (ref 0–200)
HDL: 40.8 mg/dL (ref >39.00)
LDL Cholesterol: 170 mg/dL — ABNORMAL HIGH (ref 0–99)
NonHDL: 194.95
Total CHOL/HDL Ratio: 6
Triglycerides: 124 mg/dL (ref 0.0–149.0)
VLDL: 24.8 mg/dL (ref 0.0–40.0)

## 2021-03-17 LAB — COMPREHENSIVE METABOLIC PANEL
ALT: 53 U/L (ref 0–53)
AST: 34 U/L (ref 0–37)
Albumin: 4.5 g/dL (ref 3.5–5.2)
Alkaline Phosphatase: 81 U/L (ref 39–117)
BUN: 17 mg/dL (ref 6–23)
CO2: 30 mEq/L (ref 19–32)
Calcium: 9.9 mg/dL (ref 8.4–10.5)
Chloride: 99 mEq/L (ref 96–112)
Creatinine, Ser: 1.48 mg/dL (ref 0.40–1.50)
GFR: 59.28 mL/min — ABNORMAL LOW (ref >60.00)
Glucose, Bld: 105 mg/dL — ABNORMAL HIGH (ref 70–99)
Potassium: 4.1 mEq/L (ref 3.5–5.1)
Sodium: 137 mEq/L (ref 135–145)
Total Bilirubin: 0.7 mg/dL (ref 0.2–1.2)
Total Protein: 7.7 g/dL (ref 6.0–8.3)

## 2021-03-17 MED ORDER — ATORVASTATIN CALCIUM 10 MG PO TABS: 10.0000 mg | ORAL_TABLET | Freq: Every day | ORAL | 11 refills | Status: DC

## 2021-03-17 NOTE — Addendum Note (Signed)
Addended by: Gwenevere Abbot on: 03/17/2021 08:23 PM   Modules accepted: Orders

## 2021-03-21 ENCOUNTER — Encounter: Payer: Self-pay | Admitting: Medical

## 2021-03-21 ENCOUNTER — Ambulatory Visit: Payer: BC Managed Care – PPO | Admitting: Medical

## 2021-03-21 VITALS — BP 130/74 | HR 100 | Temp 98.2°F | Resp 18 | Ht 73.0 in | Wt 364.0 lb

## 2021-03-21 DIAGNOSIS — R739 Hyperglycemia, unspecified: Secondary | ICD-10-CM

## 2021-03-21 DIAGNOSIS — E559 Vitamin D deficiency, unspecified: Secondary | ICD-10-CM

## 2021-03-21 DIAGNOSIS — R220 Localized swelling, mass and lump, head: Secondary | ICD-10-CM

## 2021-03-21 DIAGNOSIS — I10 Essential (primary) hypertension: Secondary | ICD-10-CM

## 2021-03-21 DIAGNOSIS — E785 Hyperlipidemia, unspecified: Secondary | ICD-10-CM

## 2021-03-21 MED ORDER — METOPROLOL SUCCINATE ER 50 MG PO TB24: 50.0000 mg | ORAL_TABLET | Freq: Every day | ORAL | 3 refills | Status: DC

## 2021-03-21 MED ORDER — METFORMIN HCL 500 MG PO TABS: 500.0000 mg | ORAL_TABLET | Freq: Every day | ORAL | 3 refills | Status: DC

## 2021-03-21 NOTE — Patient Instructions (Addendum)
Blood pressure well controlled. DC olmasartan as you have associated this in past with lip swelling and some itching.  Continue chlorthalidone and amlodipine. Adding on metoprolol in place of olmasartain. Please send me my chart update on bp in one weeks.  For elevated sugar and obesity prescribed metformin. Rx advisement given.   High cholesterol. Rx atorvastatin.  Discussed diet and exercise. [employer] app might be helpful.  Follow up 2-3 months or sooner if needed

## 2021-03-21 NOTE — Progress Notes (Signed)
   Subjective:    Patient ID: Wesley Obrien, male    DOB: 07-21-1980, 40 y.o.   MRN: [id number]  HPI  Pt in for follow up. Last visit his bp was 160/90.   He started back on almosartan,  amlodipoine 10 mg and cholrthalidone 25 mg daily.Pt thinks olmasartan makes his lips swell.   In the past he thinks olmasartan made his lips swell as well.  Pt cholesterol was high. On review high in the past as well. I had advised starting low dose atorvastatin. He has not started yet. Has not picked up.    Review of Systems  Constitutional:  Negative for chills, fatigue and fever.  HENT:  Negative for congestion, drooling and ear discharge.   Respiratory:  Negative for cough, chest tightness, shortness of breath and wheezing.   Cardiovascular:  Negative for chest pain and palpitations.  Gastrointestinal:  Negative for abdominal pain, blood in stool and nausea.  Genitourinary:  Negative for dysuria.  Musculoskeletal:  Negative for back pain and neck pain.  Skin:  Negative for rash.  Hematological:  Negative for adenopathy. Does not bruise/bleed easily.  Psychiatric/Behavioral:  Negative for behavioral problems and dysphoric mood.       Objective:   Physical Exam  General- No acute distress. Pleasant patient. Neck- Full range of motion, no jvd Lungs- Clear, even and unlabored. Heart- regular rate and rhythm. Neurologic- CNII- XII grossly intact.       Assessment & Plan:  Blood pressure well controlled. DC olmasartan as you have associated this in past with lip swelling and some itching.  Continue chlorthalidone and amlodipine. Adding on metoprolol in place of olmasartain. Please send me my chart update on bp in one weeks.  For elevated sugar and obesity prescribed metformin. Rx advisement given.   High cholesterol. Rx atorvastatin.  Discussed diet and exercise. [employer] app might be helpful.  Follow up 2-3 months or sooner if needed   Time spent with patient today was 20  minutes which  consisted of chart review, discussing diagnosis, work up treatment and documentation.

## 2021-05-30 ENCOUNTER — Encounter: Payer: Self-pay | Admitting: Medical

## 2021-05-30 ENCOUNTER — Other Ambulatory Visit: Payer: Self-pay | Admitting: Medical

## 2021-05-30 ENCOUNTER — Ambulatory Visit (INDEPENDENT_AMBULATORY_CARE_PROVIDER_SITE_OTHER): Payer: BC Managed Care – PPO | Admitting: Medical

## 2021-05-30 VITALS — BP 130/80 | HR 79 | Temp 98.2°F | Resp 18 | Ht 73.0 in | Wt 383.0 lb

## 2021-05-30 DIAGNOSIS — I10 Essential (primary) hypertension: Secondary | ICD-10-CM

## 2021-05-30 DIAGNOSIS — T7840XA Allergy, unspecified, initial encounter: Secondary | ICD-10-CM | POA: Diagnosis not present

## 2021-05-30 DIAGNOSIS — E785 Hyperlipidemia, unspecified: Secondary | ICD-10-CM | POA: Diagnosis not present

## 2021-05-30 DIAGNOSIS — L299 Pruritus, unspecified: Secondary | ICD-10-CM

## 2021-05-30 DIAGNOSIS — R739 Hyperglycemia, unspecified: Secondary | ICD-10-CM | POA: Diagnosis not present

## 2021-05-30 DIAGNOSIS — E559 Vitamin D deficiency, unspecified: Secondary | ICD-10-CM

## 2021-05-30 MED ORDER — CETIRIZINE HCL 10 MG PO TABS: 10.0000 mg | ORAL_TABLET | Freq: Every day | ORAL | 0 refills | Status: DC

## 2021-05-30 NOTE — Patient Instructions (Addendum)
Sporadic skin rashes with itching. Possible allergic reaction but also has likely eczema as youth and some anticubital faint rash presently. Rx zyrtec for itching. Moisturize skin daily. Avoid hot showers and refer to allergist.  Htn with partial compliance on bp meds. You don't know which medyou did not take this week.Typically you skin amlodipine or diuretic occasionally so  want you to start back on daily amlodipoine and metoprolol. Check bp daily over next week. If bp over 140/90 will need to be back on chlorthalidone.  For elevated sugar get future a1c lab.  For high cholesterol recheck future lipid panel with cmp on feb 17 7:30 am.  Low vit d recheck vit d level.  Follow up date to be determined after lab review.

## 2021-05-30 NOTE — Progress Notes (Signed)
Subjective:    Patient ID: Wesley Obrien, male    DOB: 1980/09/14, 41 y.o.   MRN: [id number]  HPI  Pt in for follow up.  He has some sporadic area of itching for past 2  months.  Pt states he stopped metformin thinking this was cause of itching but did not note a difference.  Pt see sporadic slight rash in various area left arm, rt side chest, abdomen and palms swelled one. Occasional lip tingle. His tongue never swelled. Never had sob.   Pt has recent rash that itches. Reports rash occurred after no known particular exposure. On review pt does not report any suspicious exposure to soaps, creams, detergents, make up, lotions, detergents, animal exposure exposure, plants or insect bites.  Pt rash is in the area of. Pt reports no shortness of breath or wheezing.    Not reporting any obvious stress.  As teenager did have some mild bumps in antecubital fossae.   Diabetic pt. Taking metformin.  High cholesterol- on atorvastatin.   Htn- admits sporadic compliance on chlorthalidone and amlodipine.  Review of Systems  Constitutional:  Negative for chills, fatigue and fever.  Respiratory:  Negative for cough, chest tightness, shortness of breath and wheezing.   Cardiovascular:  Negative for chest pain and palpitations.  Gastrointestinal:  Negative for abdominal pain.  Genitourinary:  Negative for dysuria.  Musculoskeletal:  Negative for joint swelling.  Skin:  Positive for rash.  Neurological:  Negative for dizziness and headaches.  Hematological:  Negative for adenopathy. Does not bruise/bleed easily.  Psychiatric/Behavioral:  Negative for behavioral problems and confusion.     Past Medical History:  Diagnosis Date   History of chicken pox    Hypertension      Social History   Socioeconomic History   Marital status: Single    Spouse name: Not on file   Number of children: Not on file   Years of education: Not on file   Highest education level: Not on file  Occupational  History   Not on file  Tobacco Use   Smoking status: Former    Types: Cigars    Quit date: 04/16/2016    Years since quitting: 5.1   Smokeless tobacco: Never  Vaping Use   Vaping Use: Some days  Substance and Sexual Activity   Alcohol use: Yes    Comment: 6 pack 2-3 times a month.   Drug use: No   Sexual activity: Yes    Birth control/protection: None  Other Topics Concern   Not on file  Social History Narrative   Not on file   Social Determinants of Health   Financial Resource Strain: Not on file  Food Insecurity: Not on file  Transportation Needs: Not on file  Physical Activity: Not on file  Stress: Not on file  Social Connections: Not on file  Intimate Partner Violence: Not on file    Past Surgical History:  Procedure Laterality Date   NO PAST SURGERIES      Family History  Problem Relation Age of Onset   Healthy Mother    Diabetes Father    Hypertension Father    Healthy Sister    Healthy Son     Allergies  Allergen Reactions   Clonidine Derivatives Swelling    Facial swelling.    Current Outpatient Medications on File Prior to Visit  Medication Sig Dispense Refill   amLODipine (NORVASC) 10 MG tablet Take 1 tablet (10 mg total) by mouth daily. 90 tablet 0  atorvastatin (LIPITOR) 10 MG tablet Take 1 tablet (10 mg total) by mouth daily. 30 tablet 11   chlorthalidone (HYGROTON) 25 MG tablet Take 1 tablet (25 mg total) by mouth daily. 90 tablet 0   metFORMIN (GLUCOPHAGE) 500 MG tablet Take 1 tablet (500 mg total) by mouth daily with breakfast. 30 tablet 3   metoprolol succinate (TOPROL-XL) 50 MG 24 hr tablet Take 1 tablet (50 mg total) by mouth daily. Take with or immediately following a meal. 30 tablet 3   No current facility-administered medications on file prior to visit.    BP 123/88    Pulse 79    Temp 98.2 F (36.8 C)    Resp 18    Ht 6\' 1"  (1.854 m)    Wt (!) 383 lb (173.7 kg)    SpO2 96%    BMI 50.53 kg/m       Objective:   Physical  Exam  General Mental Status- Alert. General Appearance- Not in acute distress.   Skin Presently faint red rash below left ear and back of neck. Faint rash medial left bicep. Slght rash in antecubial fossaes.  Neck Carotid Arteries- Normal color. Moisture- Normal Moisture. No carotid bruits. No JVD.  Chest and Lung Exam Auscultation: Breath Sounds:-Normal.  Cardiovascular Auscultation:Rythm- Regular. Murmurs & Other Heart Sounds:Auscultation of the heart reveals- No Murmurs.  Abdomen Inspection:-Inspeection Normal. Palpation/Percussion:Note:No mass. Palpation and Percussion of the abdomen reveal- Non Tender, Non Distended + BS, no rebound or guarding.  Neurologic Cranial Nerve exam:- CN III-XII intact(No nystagmus), symmetric smile. Strength:- 5/5 equal and symmetric strength both upper and lower extremities.       Assessment & Plan:   Patient Instructions  Sporadic skin rashes with itching. Possible allergic reaction but also has likely eczema as youth and some anticubital faint rash presently. Rx zyrtec for itching. Moisturize skin daily. Avoid hot showers and refer to allergist.  Htn with partial compliance on bp meds. You don't know which medyou did not take this week.Typically you skin amlodipine or diuretic occasionally so  want you to start back on daily amlodipoine and metoprolol. Check bp daily over next week. If bp over 140/90 will need to be back on chlorthalidone.  For elevated sugar get future a1c lab.  For high cholesterol recheck future lipid panel with cmp on feb 17 7:30 am.  Low vit d recheck vit d level.  Follow up date to be determined after lab review.     Feb 19, PA-C

## 2021-06-06 ENCOUNTER — Other Ambulatory Visit (INDEPENDENT_AMBULATORY_CARE_PROVIDER_SITE_OTHER): Payer: BC Managed Care – PPO

## 2021-06-06 DIAGNOSIS — E785 Hyperlipidemia, unspecified: Secondary | ICD-10-CM | POA: Diagnosis not present

## 2021-06-06 DIAGNOSIS — I10 Essential (primary) hypertension: Secondary | ICD-10-CM

## 2021-06-06 DIAGNOSIS — R739 Hyperglycemia, unspecified: Secondary | ICD-10-CM

## 2021-06-06 DIAGNOSIS — E559 Vitamin D deficiency, unspecified: Secondary | ICD-10-CM | POA: Diagnosis not present

## 2021-06-06 LAB — COMPREHENSIVE METABOLIC PANEL
ALT: 27 U/L (ref 0–53)
AST: 20 U/L (ref 0–37)
Albumin: 4.3 g/dL (ref 3.5–5.2)
Alkaline Phosphatase: 74 U/L (ref 39–117)
BUN: 16 mg/dL (ref 6–23)
CO2: 32 mEq/L (ref 19–32)
Calcium: 9 mg/dL (ref 8.4–10.5)
Chloride: 103 mEq/L (ref 96–112)
Creatinine, Ser: 1.08 mg/dL (ref 0.40–1.50)
GFR: 86.39 mL/min (ref >60.00)
Glucose, Bld: 95 mg/dL (ref 70–99)
Potassium: 4 mEq/L (ref 3.5–5.1)
Sodium: 138 mEq/L (ref 135–145)
Total Bilirubin: 0.9 mg/dL (ref 0.2–1.2)
Total Protein: 7.1 g/dL (ref 6.0–8.3)

## 2021-06-06 LAB — LIPID PANEL
Cholesterol: 135 mg/dL (ref 0–200)
HDL: 41.5 mg/dL (ref >39.00)
LDL Cholesterol: 86 mg/dL (ref 0–99)
NonHDL: 93.19
Total CHOL/HDL Ratio: 3
Triglycerides: 34 mg/dL (ref 0.0–149.0)
VLDL: 6.8 mg/dL (ref 0.0–40.0)

## 2021-06-06 LAB — HEMOGLOBIN A1C: Hgb A1c MFr Bld: 6.1 % (ref 4.6–6.5)

## 2021-06-10 LAB — VITAMIN D 1,25 DIHYDROXY
Vitamin D 1, 25 (OH)2 Total: 56 pg/mL (ref 18–72)
Vitamin D2 1, 25 (OH)2: 29 pg/mL
Vitamin D3 1, 25 (OH)2: 27 pg/mL

## 2021-08-06 ENCOUNTER — Encounter: Payer: Self-pay | Admitting: Allergy

## 2021-08-06 ENCOUNTER — Ambulatory Visit (INDEPENDENT_AMBULATORY_CARE_PROVIDER_SITE_OTHER): Payer: BC Managed Care – PPO | Admitting: Allergy

## 2021-08-06 VITALS — BP 160/104 | HR 91 | Temp 97.9°F | Resp 16 | Ht 71.0 in | Wt 374.6 lb

## 2021-08-06 DIAGNOSIS — T783XXD Angioneurotic edema, subsequent encounter: Secondary | ICD-10-CM

## 2021-08-06 DIAGNOSIS — L2089 Other atopic dermatitis: Secondary | ICD-10-CM | POA: Diagnosis not present

## 2021-08-06 DIAGNOSIS — H1013 Acute atopic conjunctivitis, bilateral: Secondary | ICD-10-CM | POA: Diagnosis not present

## 2021-08-06 DIAGNOSIS — L508 Other urticaria: Secondary | ICD-10-CM

## 2021-08-06 DIAGNOSIS — H109 Unspecified conjunctivitis: Secondary | ICD-10-CM

## 2021-08-06 DIAGNOSIS — J310 Chronic rhinitis: Secondary | ICD-10-CM | POA: Diagnosis not present

## 2021-08-06 MED ORDER — OLOPATADINE HCL 0.2 % OP SOLN: 1.0000 [drp] | Freq: Every day | OPHTHALMIC | 5 refills | Status: DC

## 2021-08-06 MED ORDER — RYALTRIS 665-25 MCG/ACT NA SUSP: 2.0000 | Freq: Two times a day (BID) | NASAL | 5 refills | Status: AC

## 2021-08-06 MED ORDER — FAMOTIDINE 20 MG PO TABS: 20.0000 mg | ORAL_TABLET | Freq: Two times a day (BID) | ORAL | 5 refills | Status: DC

## 2021-08-06 MED ORDER — TRIAMCINOLONE ACETONIDE 0.1 % EX CREA: 1.0000 "application " | TOPICAL_CREAM | Freq: Two times a day (BID) | CUTANEOUS | 5 refills | Status: DC

## 2021-08-06 NOTE — Patient Instructions (Addendum)
Chronic hives and swelling ?- at this time etiology of hives and swelling is unknown.  Hives can be caused by a variety of different triggers including illness/infection, foods, medications, stings, exercise, pressure, vibrations, extremes of temperature to name a few however majority of the time there is no identifiable trigger.  Your symptoms have been ongoing for >6 weeks making this chronic thus will obtain labwork to evaluate: CBC w diff, CMP, tryptase, hive panel, environmental panel, alpha-gal panel ?- for hive management recommend high-dose antihistamine regimen: Xyzal 5mg  twice a day with Pepcid 20mg  twice a day ?- if this antihistamine regimen is not enough to control symptoms then may need to add in Singulair or start Xolair monthly injections.  Xolair is indicated for chronic spontaneous hives.   ? ?Allergies ?- as above obtaining environmental allergy panel ?- antihistamine as above ?- for nasal congestion/drainage can use Ryaltris nasal spray 2 sprays each nostril twice a day as needed ?- for itchy/watery eyes can use Pataday 1 drop each eye daily as needed ? ?Eczema ?- bathe and soak for 5-10 minutes in warm water once a day. Pat dry.  Immediately apply the below cream prescribed to flared areas (red, irritated, dry, itchy, patchy, scaly, flaky) only. Wait several minutes and then apply your moisturizer all over.   ? ?To affected areas on the body (below the face and neck), apply: ?Triamcinolone 0.1 % ointment twice a day as needed. ?With ointments be careful to avoid the armpits and groin area. ? ?Follow-up in 2-3 months or sooner if needed ? ?

## 2021-08-06 NOTE — Progress Notes (Signed)
? ? ?New Patient Note ? ?RE: Wesley Obrien MRN: [id number] DOB: 01-28-1981 ?Date of Office Visit: 08/06/2021 ? ?Primary care provider: Esperanza RichtersSaguier, Edward, PA-C ? ?Chief Complaint: itchy rash ? ?History of present illness: ?Wesley GardenerBeau Berthold is a 41 y.o. male presenting today for evaluation of urticaria.  ? ?He states he has been having an itchy rash that comes and goes over his body.  He has had associated swelling of the face.  He reports his palms, feet, arms, neck, chest, beard area, scalp have all been affected.  The rash is red, bumpy and migrates around the body.  The rash can last less than 30 minutes usually.  The rash does not leave any bruising once resolved.  This has been going on since January 2023.  Has not had similar rash like this before randomly.  He has tried to change detergents/body products but this did not help.  No preceding illness, no new medications however he states he did switch medications around the time the rash started up.  However he hasn't taken the medication in about a month and he has continued to have hives.  No stings/bites.  He has not identified any physical features.  No joint aches/pains.  No fever.  He does not note any exacerbating factors.  He has been taking zyrtec 1-2 tabs a day but states benadryl works better.   ? ?He also reports symptoms with pollen including itchy/watery eyes, runny/stuffy nose, sneezing, itchy throat, headache.  Zyrtec helps some for the symptoms. ? ?He does state during the warmer months he can have a flare of eczema on the legs and arm creases.  He has used creams topically to help with this rash before. ? ?Review of systems: ?Review of Systems  ?Constitutional: Negative.   ?HENT:    ?     See HPI  ?Eyes: Negative.   ?Respiratory: Negative.    ?Cardiovascular: Negative.   ?Musculoskeletal: Negative.   ?Skin:  Positive for rash.  ?Allergic/Immunologic: Negative.   ?Neurological: Negative.   ? ?All other systems negative unless noted above in  HPI ? ?Past medical history: ?Past Medical History:  ?Diagnosis Date  ? Angio-edema   ? History of chicken pox   ? Hypertension   ? Urticaria   ? ? ?Past surgical history: ?Past Surgical History:  ?Procedure Laterality Date  ? NO PAST SURGERIES    ? ? ?Family history:  ?Family History  ?Problem Relation Age of Onset  ? Allergic rhinitis Mother   ? Healthy Mother   ? Diabetes Father   ? Hypertension Father   ? Healthy Sister   ? Allergic rhinitis Son   ? Healthy Son   ? ? ?Social history: ?Lives in a home with carpeting in bedroom with electric heating and central cooling.  4 dogs and 1 [place] pig in home.  No concern for water damage, mildew or roaches in the home.  Works as a [employer] driver.  Reports currently vapes.   ?Tobacco Use  ? Smoking status: Former  ?  Types: Cigars  ?  Quit date: 04/16/2016  ?  Years since quitting: 5.3  ? Smokeless tobacco: Never  ?Vaping Use  ? Vaping Use: Some days  ? ?Medication List: ?Current Outpatient Medications  ?Medication Sig Dispense Refill  ? cetirizine (ZYRTEC) 10 MG tablet TAKE 1 TABLET(10 MG) BY MOUTH DAILY 90 tablet 3  ? amLODipine (NORVASC) 10 MG tablet Take 1 tablet (10 mg total) by mouth daily. (Patient not taking: Reported on 08/06/2021)  90 tablet 0  ? atorvastatin (LIPITOR) 10 MG tablet Take 1 tablet (10 mg total) by mouth daily. (Patient not taking: Reported on 08/06/2021) 30 tablet 11  ? chlorthalidone (HYGROTON) 25 MG tablet Take 1 tablet (25 mg total) by mouth daily. (Patient not taking: Reported on 08/06/2021) 90 tablet 0  ? metFORMIN (GLUCOPHAGE) 500 MG tablet Take 1 tablet (500 mg total) by mouth daily with breakfast. (Patient not taking: Reported on 08/06/2021) 30 tablet 3  ? metoprolol succinate (TOPROL-XL) 50 MG 24 hr tablet Take 1 tablet (50 mg total) by mouth daily. Take with or immediately following a meal. (Patient not taking: Reported on 08/06/2021) 30 tablet 3  ? Vitamin D, Ergocalciferol, (DRISDOL) 1.25 MG (50000 UNIT) CAPS capsule Take 50,000 Units by  mouth once a week. (Patient not taking: Reported on 08/06/2021)    ? ?No current facility-administered medications for this visit.  ? ? ?Known medication allergies: ?Allergies  ?Allergen Reactions  ? Clonidine Derivatives Swelling  ?  Facial swelling.  ? ? ? ?Physical examination: ?Blood pressure (!) 160/104, pulse 91, temperature 97.9 ?F (36.6 ?C), temperature source Temporal, resp. rate 16, height 5\' 11"  (1.803 m), weight (!) 374 lb 9.6 oz (169.9 kg), SpO2 95 %. ? ?General: Alert, interactive, in no acute distress. ?HEENT: PERRLA, TMs pearly gray, turbinates moderately edematous without discharge, post-pharynx non erythematous. ?Neck: Supple without lymphadenopathy. ?Lungs: Clear to auscultation without wheezing, rhonchi or rales. {no increased work of breathing. ?CV: Normal S1, S2 without murmurs. ?Abdomen: Nondistended, nontender. ?Skin: Scattered erythematous urticarial type lesions primarily located right side forehead, left upper arm , nonvesicular. ?Extremities:  No clubbing, cyanosis or edema. ?Neuro:   Grossly intact. ? ?Diagnositics/Labs: ? ?Allergy testing: Deferred due to ongoing urticaria ? ?Assessment and plan: ?Chronic hives and swelling ?- at this time etiology of hives and swelling is unknown.  Hives can be caused by a variety of different triggers including illness/infection, foods, medications, stings, exercise, pressure, vibrations, extremes of temperature to name a few however majority of the time there is no identifiable trigger.  Your symptoms have been ongoing for >6 weeks making this chronic thus will obtain labwork to evaluate: CBC w diff, CMP, tryptase, hive panel, environmental panel, alpha-gal panel ?- for hive management recommend high-dose antihistamine regimen: Xyzal 5mg  twice a day with Pepcid 20mg  twice a day ?- if this antihistamine regimen is not enough to control symptoms then may need to add in Singulair or start Xolair monthly injections.  Xolair is indicated for chronic  spontaneous hives.   ? ?Rhinoconjunctivitis, presumed allergic ?- as above obtaining environmental allergy panel ?- antihistamine as above ?- for nasal congestion/drainage can use Ryaltris nasal spray 2 sprays each nostril twice a day as needed ?- for itchy/watery eyes can use Pataday 1 drop each eye daily as needed ? ?Eczema ?- bathe and soak for 5-10 minutes in warm water once a day. Pat dry.  Immediately apply the below cream prescribed to flared areas (red, irritated, dry, itchy, patchy, scaly, flaky) only. Wait several minutes and then apply your moisturizer all over.   ? ?To affected areas on the body (below the face and neck), apply: ?Triamcinolone 0.1 % ointment twice a day as needed. ?With ointments be careful to avoid the armpits and groin area. ? ?Follow-up in 2-3 months or sooner if needed ?I appreciate the opportunity to take part in Dev's care. Please do not hesitate to contact me with questions. ? ?Sincerely, ? ? ? , MD ?Allergy/Immunology ?[facility] ?

## 2021-08-14 ENCOUNTER — Encounter: Payer: Self-pay | Admitting: Allergy

## 2021-08-14 ENCOUNTER — Ambulatory Visit: Payer: BC Managed Care – PPO | Admitting: Allergy

## 2021-08-14 VITALS — BP 144/90 | HR 85 | Temp 98.6°F | Resp 16 | Ht 73.0 in | Wt 387.0 lb

## 2021-08-14 DIAGNOSIS — J3089 Other allergic rhinitis: Secondary | ICD-10-CM | POA: Diagnosis not present

## 2021-08-14 DIAGNOSIS — L2089 Other atopic dermatitis: Secondary | ICD-10-CM

## 2021-08-14 DIAGNOSIS — H1013 Acute atopic conjunctivitis, bilateral: Secondary | ICD-10-CM

## 2021-08-14 DIAGNOSIS — L508 Other urticaria: Secondary | ICD-10-CM | POA: Diagnosis not present

## 2021-08-14 DIAGNOSIS — T783XXD Angioneurotic edema, subsequent encounter: Secondary | ICD-10-CM

## 2021-08-14 MED ORDER — LEVOCETIRIZINE DIHYDROCHLORIDE 5 MG PO TABS: 5.0000 mg | ORAL_TABLET | Freq: Two times a day (BID) | ORAL | 5 refills | Status: DC

## 2021-08-14 NOTE — Patient Instructions (Addendum)
Chronic hives and swelling ?- at this time etiology of hives and swelling is unknown.  Hives can be caused by a variety of different triggers including illness/infection, foods, medications, stings, exercise, pressure, vibrations, extremes of temperature to name a few however majority of the time there is no identifiable trigger.   ?- most of your labwork is back and is reassuring but does show you have allergies to dog, tree pollen and grass pollen.  Allergen avoidance measures discussed/handouts provided ?- we will call you once the last lab test returns ? ?- for hive management recommend high-dose antihistamine regimen: Xyzal 5mg  twice a day with Pepcid 20mg  twice a day ?- if this antihistamine regimen is not enough to control symptoms then may need to add in Singulair or start Xolair monthly injections.  Xolair is indicated for chronic spontaneous hives.   ? ?Allergies ?- avoidance measures provided for tree pollen, grass pollen and dog ?- use Xyzal 5mg  twice a day as this time ?- for nasal congestion/drainage can use Ryaltris nasal spray 2 sprays each nostril twice a day as needed ?- for itchy/watery eyes can use Pataday 1 drop each eye daily as needed ? ?Eczema ?- bathe and soak for 5-10 minutes in warm water once a day. Pat dry.  Immediately apply the below cream prescribed to flared areas (red, irritated, dry, itchy, patchy, scaly, flaky) only. Wait several minutes and then apply your moisturizer all over.   ? ?To affected areas on the body (below the face and neck), apply: ?Triamcinolone 0.1 % ointment twice a day as needed. ?With ointments be careful to avoid the armpits and groin area. ? ?Follow-up in 4-6 months or sooner if needed ? ?

## 2021-08-14 NOTE — Progress Notes (Signed)
? ? ?Follow-up Note ? ?RE: Wesley Obrien MRN: [id number] DOB: December 12, 1980 ?Date of Office Visit: 08/14/2021 ? ? ?History of present illness: ?Wesley Obrien is a 41 y.o. male presenting today for follow-up of chronic urticaria with angioedema, rhinoconjunctivitis as well as eczema.  His last visit office was initial visit on 08/06/2021.  It was recommended that he have follow-up and 2 to 3 months however he wanted to come in sooner than that and scheduled for today. ?He states his allergy symptoms are just awful right now.  Has a lot of drainage sting to cough and sore throat and itchy eyes.  He is levocetirizine at this time.  He did use the Ryaltris nasal spray really just once yesterday.  He also has access to allergy eyedrops. ?Fortunately in the past week he has not had hive episodes of swelling.  He is currently not doing Xyzal or Pepcid consistently but states he did take some Pepcid yesterday. ?He states his eczema has not flared and has not needed to use the triamcinolone in the past week. ? ?Review of systems in the past week: ?Review of Systems  ?Constitutional: Negative.   ?HENT:    ?     See HPI  ?Eyes:   ?     See HPI  ?Respiratory: Negative.    ?Cardiovascular: Negative.   ?Musculoskeletal: Negative.   ?Skin: Negative.   ?Allergic/Immunologic: Negative.   ?Neurological: Negative.    ? ?All other systems negative unless noted above in HPI ? ?Past medical/social/surgical/family history have been reviewed and are unchanged unless specifically indicated below. ? ?No changes ? ?Medication List: ?Current Outpatient Medications  ?Medication Sig Dispense Refill  ? cetirizine (ZYRTEC) 10 MG tablet TAKE 1 TABLET(10 MG) BY MOUTH DAILY 90 tablet 3  ? chlorthalidone (HYGROTON) 25 MG tablet Take 1 tablet (25 mg total) by mouth daily. 90 tablet 0  ? famotidine (PEPCID) 20 MG tablet Take 1 tablet (20 mg total) by mouth 2 (two) times daily. 30 tablet 5  ? Olopatadine HCl 0.2 % SOLN Apply 1 drop to eye daily. 2.5 mL 5  ?  Olopatadine-Mometasone (RYALTRIS) [id number] MCG/ACT SUSP Place 2 sprays into the nose in the morning and at bedtime. 29 g 5  ? triamcinolone cream (KENALOG) 0.1 % Apply 1 application. topically 2 (two) times daily. 30 g 5  ? amLODipine (NORVASC) 10 MG tablet Take 1 tablet (10 mg total) by mouth daily. (Patient not taking: Reported on 08/06/2021) 90 tablet 0  ? atorvastatin (LIPITOR) 10 MG tablet Take 1 tablet (10 mg total) by mouth daily. (Patient not taking: Reported on 08/06/2021) 30 tablet 11  ? metFORMIN (GLUCOPHAGE) 500 MG tablet Take 1 tablet (500 mg total) by mouth daily with breakfast. (Patient not taking: Reported on 08/06/2021) 30 tablet 3  ? metoprolol succinate (TOPROL-XL) 50 MG 24 hr tablet Take 1 tablet (50 mg total) by mouth daily. Take with or immediately following a meal. (Patient not taking: Reported on 08/06/2021) 30 tablet 3  ? Vitamin D, Ergocalciferol, (DRISDOL) 1.25 MG (50000 UNIT) CAPS capsule Take 50,000 Units by mouth once a week. (Patient not taking: Reported on 08/06/2021)    ? ?No current facility-administered medications for this visit.  ?  ? ?Known medication allergies: ?Allergies  ?Allergen Reactions  ? Clonidine Derivatives Swelling  ?  Facial swelling.  ? ? ? ?Physical examination: ?Blood pressure (!) 144/90, pulse 85, temperature 98.6 ?F (37 ?C), resp. rate 16, height 6' 1"  (1.854 m), weight (!) 387 lb (175.5  kg), SpO2 95 %. ? ?General: Alert, interactive, in no acute distress. ?HEENT: PERRLA, TMs pearly gray, turbinates moderately edematous with clear discharge, post-pharynx non erythematous. ?Neck: Supple without lymphadenopathy. ?Lungs: Clear to auscultation without wheezing, rhonchi or rales. {no increased work of breathing. ?CV: Normal S1, S2 without murmurs. ?Abdomen: Nondistended, nontender. ?Skin: Warm and dry, without lesions or rashes. ?Extremities:  No clubbing, cyanosis or edema. ?Neuro:   Grossly intact. ? ?Diagnositics/Labs: ?Labs:  ?Component ?    Latest Ref Rng 08/06/2021   ?D Pteronyssinus IgE ?    Class 0 kU/L <0.10   ?D Farinae IgE ?    Class 0 kU/L <0.10   ?Cat Dander IgE ?    Class 0 kU/L <0.10   ?Dog Dander IgE ?    Class I kU/L 0.39 !   ?[place] Grass IgE ?    Class V kU/L 29.10 !   ?Timothy Grass IgE ?    Class V kU/L 49.10 !   ?Johnson Grass IgE ?    Class IV kU/L 15.00 !   ?Cockroach, [place] IgE ?    Class 0 kU/L <0.10   ?Penicillium Chrysogen IgE ?    Class 0 kU/L <0.10   ?Cladosporium Herbarum IgE ?    Class 0 kU/L <0.10   ?Aspergillus Fumigatus IgE ?    Class 0 kU/L <0.10   ?Alternaria Alternata IgE ?    Class 0 kU/L <0.10   ?Maple/Box Elder IgE ?    Class II kU/L 0.71 !   ?Common Silver Wendee Copp IgE ?    Class 0/I kU/L 0.15 !   ?Tiffin IgE ?    Class 0/I kU/L 0.15 !   ?Oak, [place] IgE ?    Class 0 kU/L <0.10   ?Elm, American IgE ?    Class 0/I kU/L 0.13 !   ?Cottonwood IgE ?    Class 0/I kU/L 0.22 !   ?Pecan, Hickory IgE ?    Class III kU/L 2.08 !   ?White Mulberry IgE ?    Class 0 kU/L <0.10   ?Ragweed, Short IgE ?    Class 0 kU/L <0.10   ?Pigweed, Rough IgE ?    Class 0 kU/L <0.10   ?Sheep Sorrel IgE Qn ?    Class 0 kU/L <0.10   ?Mouse Urine IgE ?    Class 0 kU/L <0.10   ?WBC ?    3.4 - 10.8 x10E3/uL 6.7   ?RBC ?    4.14 - 5.80 x10E6/uL 5.41   ?Hemoglobin ?    13.0 - 17.7 g/dL 15.5   ?HCT ?    37.5 - 51.0 % 46.1   ?MCV ?    79 - 97 fL 85   ?MCH ?    26.6 - 33.0 pg 28.7   ?MCHC ?    31.5 - 35.7 g/dL 33.6   ?RDW ?    11.6 - 15.4 % 13.0   ?Neutrophils ?    Not Estab. % 59   ?Lymphs ?    Not Estab. % 27   ?Monocytes ?    Not Estab. % 9   ?Eos ?    Not Estab. % 5   ?Basos ?    Not Estab. % 0   ?NEUT# ?    1.4 - 7.0 x10E3/uL 4.0   ?Lymphocyte # ?    0.7 - 3.1 x10E3/uL 1.8   ?Monocytes Absolute ?    0.1 - 0.9 x10E3/uL 0.6   ?  EOS (ABSOLUTE) ?    0.0 - 0.4 x10E3/uL 0.3   ?Basophils Absolute ?    0.0 - 0.2 x10E3/uL 0.0   ?Immature Granulocytes ?    Not Estab. % 0   ?Immature Grans (Abs) ?    0.0 - 0.1 x10E3/uL 0.0   ?Glucose ?    70 - 99 mg/dL 71   ?BUN ?    6 - 20 mg/dL 14    ?Creatinine ?    0.76 - 1.27 mg/dL 1.23   ?eGFR ?    >59 mL/min/1.73 77   ?BUN/Creatinine Ratio ?    9 - 20  11   ?Sodium ?    134 - 144 mmol/L 142   ?Potassium ?    3.5 - 5.2 mmol/L 4.0   ?Chloride ?    96 - 106 mmol/L 104   ?CO2 ?    20 - 29 mmol/L 23   ?Calcium ?    8.7 - 10.2 mg/dL 9.3   ?Total Protein ?    6.0 - 8.5 g/dL 7.1   ?Albumin ?    4.0 - 5.0 g/dL 4.2   ?Globulin, Total ?    1.5 - 4.5 g/dL 2.9   ?Albumin/Globulin Ratio ?    1.2 - 2.2  1.4   ?Total Bilirubin ?    0.0 - 1.2 mg/dL 0.8   ?Alkaline Phosphatase ?    44 - 121 IU/L 87   ?AST ?    0 - 40 IU/L 19   ?ALT ?    0 - 44 IU/L 28   ?Class Description Allergens Comment   ?IgE (Immunoglobulin E), Serum ?    6 - 495 IU/mL 251   ?O215-IgE Alpha-Gal ?    Class 0 kU/L <0.10   ?Beef IgE ?    Class 0 kU/L <0.10   ?Pork IgE ?    Class 0 kU/L <0.10   ?Allergen Lamb IgE ?    Class 0 kU/L <0.10   ?Thyroperoxidase Ab SerPl-aCnc ?    0 - 34 IU/mL 11   ?Thyroglobulin Antibody ?    0.0 - 0.9 IU/mL <1.0   ?cu index Comment (P)  ?Tryptase ?    2.2 - 13.2 ug/L 6.6   ? ? ?Assessment and plan: ?  ?Chronic hives and swelling ?- at this time etiology of hives and swelling is unknown.  Hives can be caused by a variety of different triggers including illness/infection, foods, medications, stings, exercise, pressure, vibrations, extremes of temperature to name a few however majority of the time there is no identifiable trigger.   ?- reviewed available lab work with him today. Most of your labwork is back and is reassuring but does show you have allergies to dog, tree pollen and grass pollen.  Allergen avoidance measures discussed/handouts provided ?- we will call you once the last lab test returns ? ?- for hive management recommend high-dose antihistamine regimen: Xyzal 18m twice a day with Pepcid 284mtwice a day ?- if this antihistamine regimen is not enough to control symptoms then may need to add in Singulair or start Xolair monthly injections.  Xolair is indicated for  chronic spontaneous hives.   ? ?Allergic rhinitis with conjunctivitis ?- avoidance measures provided for tree pollen, grass pollen and dog ?- use Xyzal [id number] a day as this time ?- for nasal congestion/

## 2021-08-15 LAB — ALPHA-GAL PANEL
Allergen Lamb IgE: <0.1 kU/L
Beef IgE: <0.1 kU/L
IgE (Immunoglobulin E), Serum: 251 [IU]/mL (ref 6–495)
O215-IgE Alpha-Gal: <0.1 kU/L
Pork IgE: <0.1 kU/L

## 2021-08-15 LAB — CBC WITH DIFFERENTIAL
Basophils Absolute: 0 x10E3/uL (ref 0.0–0.2)
Basos: 0 %
EOS (ABSOLUTE): 0.3 x10E3/uL (ref 0.0–0.4)
Eos: 5 %
Hematocrit: 46.1 % (ref 37.5–51.0)
Hemoglobin: 15.5 g/dL (ref 13.0–17.7)
Immature Grans (Abs): 0 x10E3/uL (ref 0.0–0.1)
Immature Granulocytes: 0 %
Lymphocytes Absolute: 1.8 x10E3/uL (ref 0.7–3.1)
Lymphs: 27 %
MCH: 28.7 pg (ref 26.6–33.0)
MCHC: 33.6 g/dL (ref 31.5–35.7)
MCV: 85 fL (ref 79–97)
Monocytes Absolute: 0.6 x10E3/uL (ref 0.1–0.9)
Monocytes: 9 %
Neutrophils Absolute: 4 x10E3/uL (ref 1.4–7.0)
Neutrophils: 59 %
RBC: 5.41 x10E6/uL (ref 4.14–5.80)
RDW: 13 % (ref 11.6–15.4)
WBC: 6.7 x10E3/uL (ref 3.4–10.8)

## 2021-08-15 LAB — ALLERGEN PROFILE WITH TOTAL IGE, RESPIRATORY-AREA 2
Alternaria Alternata IgE: <0.1 kU/L
Aspergillus Fumigatus IgE: <0.1 kU/L
Bermuda Grass IgE: 29.1 kU/L — AB
Cat Dander IgE: <0.1 kU/L
Cedar, Mountain IgE: 0.15 kU/L — AB
Cladosporium Herbarum IgE: <0.1 kU/L
Cockroach, German IgE: <0.1 kU/L
Common Silver Birch IgE: 0.15 kU/L — AB
Cottonwood IgE: 0.22 kU/L — AB
D Farinae IgE: <0.1 kU/L
D Pteronyssinus IgE: <0.1 kU/L
Dog Dander IgE: 0.39 kU/L — AB
Elm, American IgE: 0.13 kU/L — AB
Johnson Grass IgE: 15 kU/L — AB
Maple/Box Elder IgE: 0.71 kU/L — AB
Mouse Urine IgE: <0.1 kU/L
Oak, White IgE: <0.1 kU/L
Pecan, Hickory IgE: 2.08 kU/L — AB
Penicillium Chrysogen IgE: <0.1 kU/L
Pigweed, Rough IgE: <0.1 kU/L
Ragweed, Short IgE: <0.1 kU/L
Sheep Sorrel IgE Qn: <0.1 kU/L
Timothy Grass IgE: 49.1 kU/L — AB
White Mulberry IgE: <0.1 kU/L

## 2021-08-15 LAB — CHRONIC URTICARIA: cu index: 7.2 (ref <10)

## 2021-08-15 LAB — COMPREHENSIVE METABOLIC PANEL WITH GFR
ALT: 28 IU/L (ref 0–44)
AST: 19 IU/L (ref 0–40)
Albumin/Globulin Ratio: 1.4 (ref 1.2–2.2)
Albumin: 4.2 g/dL (ref 4.0–5.0)
Alkaline Phosphatase: 87 IU/L (ref 44–121)
BUN/Creatinine Ratio: 11 (ref 9–20)
BUN: 14 mg/dL (ref 6–20)
Bilirubin Total: 0.8 mg/dL (ref 0.0–1.2)
CO2: 23 mmol/L (ref 20–29)
Calcium: 9.3 mg/dL (ref 8.7–10.2)
Chloride: 104 mmol/L (ref 96–106)
Creatinine, Ser: 1.23 mg/dL (ref 0.76–1.27)
Globulin, Total: 2.9 g/dL (ref 1.5–4.5)
Glucose: 71 mg/dL (ref 70–99)
Potassium: 4 mmol/L (ref 3.5–5.2)
Sodium: 142 mmol/L (ref 134–144)
Total Protein: 7.1 g/dL (ref 6.0–8.5)
eGFR: 77 mL/min/1.73

## 2021-08-15 LAB — THYROID ANTIBODIES (THYROPEROXIDASE & THYROGLOBULIN)
Thyroglobulin Antibody: <1 [IU]/mL (ref 0.0–0.9)
Thyroperoxidase Ab SerPl-aCnc: 11 [IU]/mL (ref 0–34)

## 2021-08-15 LAB — TRYPTASE: Tryptase: 6.6 ug/L (ref 2.2–13.2)

## 2021-10-06 ENCOUNTER — Telehealth: Payer: Self-pay | Admitting: Medical

## 2021-10-06 MED ORDER — AMLODIPINE BESYLATE 10 MG PO TABS: 10.0000 mg | ORAL_TABLET | Freq: Every day | ORAL | 0 refills | Status: DC

## 2021-10-06 MED ORDER — METOPROLOL SUCCINATE ER 50 MG PO TB24: 50.0000 mg | ORAL_TABLET | Freq: Every day | ORAL | 3 refills | Status: DC

## 2021-10-06 MED ORDER — METFORMIN HCL 500 MG PO TABS: 500.0000 mg | ORAL_TABLET | Freq: Every day | ORAL | 3 refills | Status: DC

## 2021-10-06 NOTE — Telephone Encounter (Signed)
Rx sent 

## 2021-10-06 NOTE — Telephone Encounter (Signed)
Medication:   metFORMIN (GLUCOPHAGE) 500 MG tablet [[id number]]   metoprolol succinate (TOPROL-XL) 50 MG 24 hr tablet [[id number]]  amLODipine (NORVASC) 10 MG tablet [[id number]]   Has the patient contacted their pharmacy? No. (If no, request that the patient contact the pharmacy for the refill.) (If yes, when and what did the pharmacy advise?)  Preferred Pharmacy (with phone number or street name):   [facility]  Agent: Please be advised that RX refills may take up to 3 business days. We ask that you follow-up with your pharmacy.

## 2021-10-31 ENCOUNTER — Ambulatory Visit (INDEPENDENT_AMBULATORY_CARE_PROVIDER_SITE_OTHER): Payer: BC Managed Care – PPO | Admitting: Medical

## 2021-10-31 ENCOUNTER — Encounter: Payer: Self-pay | Admitting: Medical

## 2021-10-31 VITALS — BP 120/70 | HR 76 | Resp 18 | Ht 73.0 in | Wt 378.0 lb

## 2021-10-31 DIAGNOSIS — Z800 Family history of malignant neoplasm of digestive organs: Secondary | ICD-10-CM

## 2021-10-31 DIAGNOSIS — Z8371 Family history of colonic polyps: Secondary | ICD-10-CM | POA: Diagnosis not present

## 2021-10-31 DIAGNOSIS — Z0000 Encounter for general adult medical examination without abnormal findings: Secondary | ICD-10-CM | POA: Diagnosis not present

## 2021-10-31 DIAGNOSIS — R739 Hyperglycemia, unspecified: Secondary | ICD-10-CM | POA: Diagnosis not present

## 2021-10-31 NOTE — Patient Instructions (Addendum)
For you wellness exam today I have ordered cbc, cmp and  lipid panel.  Up to date on vaccines  Recommend exercise and healthy diet.  We will let you know lab results as they come in.  Follow up date appointment will be determined after lab review.    Obesity- discussed weight loss and his failed efforts. Currently on metformin. Counseled on wegovy or ozempic if he decided he wants to try benefit, risk and contraindications discussed.  Refer for evaluation for screening colonoscopy based on family history.  Preventive Care 21-33 Years Old, Male Preventive care refers to lifestyle choices and visits with your health care provider that can promote health and wellness. Preventive care visits are also called wellness exams. What can I expect for my preventive care visit? Counseling During your preventive care visit, your health care provider may ask about your: Medical history, including: Past medical problems. Family medical history. Current health, including: Emotional well-being. Home life and relationship well-being. Sexual activity. Lifestyle, including: Alcohol, nicotine or tobacco, and drug use. Access to firearms. Diet, exercise, and sleep habits. Safety issues such as seatbelt and bike helmet use. Sunscreen use. Work and work Astronomer. Physical exam Your health care provider will check your: Height and weight. These may be used to calculate your BMI (body mass index). BMI is a measurement that tells if you are at a healthy weight. Waist circumference. This measures the distance around your waistline. This measurement also tells if you are at a healthy weight and may help predict your risk of certain diseases, such as type 2 diabetes and high blood pressure. Heart rate and blood pressure. Body temperature. Skin for abnormal spots. What immunizations do I need?  Vaccines are usually given at various ages, according to a schedule. Your health care provider will recommend  vaccines for you based on your age, medical history, and lifestyle or other factors, such as travel or where you work. What tests do I need? Screening Your health care provider may recommend screening tests for certain conditions. This may include: Lipid and cholesterol levels. Diabetes screening. This is done by checking your blood sugar (glucose) after you have not eaten for a while (fasting). Hepatitis B test. Hepatitis C test. HIV (human immunodeficiency virus) test. STI (sexually transmitted infection) testing, if you are at risk. Lung cancer screening. Prostate cancer screening. Colorectal cancer screening. Talk with your health care provider about your test results, treatment options, and if necessary, the need for more tests. Follow these instructions at home: Eating and drinking  Eat a diet that includes fresh fruits and vegetables, whole grains, lean protein, and low-fat dairy products. Take vitamin and mineral supplements as recommended by your health care provider. Do not drink alcohol if your health care provider tells you not to drink. If you drink alcohol: Limit how much you have to 0-2 drinks a day. Know how much alcohol is in your drink. In the [place] one drink equals one 12 oz bottle of beer (355 mL), one 5 oz glass of wine (148 mL), or one 1 oz glass of hard liquor (44 mL). Lifestyle Brush your teeth every morning and night with fluoride toothpaste. Floss one time each day. Exercise for at least 30 minutes 5 or more days each week. Do not use any products that contain nicotine or tobacco. These products include cigarettes, chewing tobacco, and vaping devices, such as e-cigarettes. If you need help quitting, ask your health care provider. Do not use drugs. If you are sexually active,  practice safe sex. Use a condom or other form of protection to prevent STIs. Take aspirin only as told by your health care provider. Make sure that you understand how much to take and what  form to take. Work with your health care provider to find out whether it is safe and beneficial for you to take aspirin daily. Find healthy ways to manage stress, such as: Meditation, yoga, or listening to music. Journaling. Talking to a trusted person. Spending time with friends and family. Minimize exposure to UV radiation to reduce your risk of skin cancer. Safety Always wear your seat belt while driving or riding in a vehicle. Do not drive: If you have been drinking alcohol. Do not ride with someone who has been drinking. When you are tired or distracted. While texting. If you have been using any mind-altering substances or drugs. Wear a helmet and other protective equipment during sports activities. If you have firearms in your house, make sure you follow all gun safety procedures. What's next? Go to your health care provider once a year for an annual wellness visit. Ask your health care provider how often you should have your eyes and teeth checked. Stay up to date on all vaccines. This information is not intended to replace advice given to you by your health care provider. Make sure you discuss any questions you have with your health care provider. Document Revised: 10/02/2020 Document Reviewed: 10/02/2020 Elsevier Patient Education  2023 [employer].

## 2021-10-31 NOTE — Progress Notes (Signed)
Subjective:    Patient ID: Wesley Obrien, male    DOB: Oct 06, 1980, 41 y.o.   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 Pt states  exercising past 1 month, pt states moderate healthy diet but can improve, one soda a week. One son 79 yo. Wife also pregnant.   Htn- pt states recently compliant. Pt on amlodipine and  metropol. He states not on chlorthaldinone.  Up to date on vaccines.  Pt want to lose weight. He has not lost with     Review of Systems  Constitutional:  Negative for chills and fever.  HENT:  Negative for congestion and drooling.   Respiratory:  Negative for cough, chest tightness, shortness of breath and wheezing.   Cardiovascular:  Negative for chest pain and palpitations.  Gastrointestinal:  Negative for abdominal pain, blood in stool, constipation and vomiting.  Genitourinary:  Negative for dysuria and frequency.  Musculoskeletal:  Negative for back pain, joint swelling and neck stiffness.  Skin:  Negative for rash.  Neurological:  Negative for dizziness, syncope, numbness and headaches.  Hematological:  Negative for adenopathy. Does not bruise/bleed easily.  Psychiatric/Behavioral:  Negative for behavioral problems and decreased concentration. The patient is not nervous/anxious.      Past Medical History:  Diagnosis Date   Angio-edema    History of chicken pox    Hypertension    Urticaria      Social History   Socioeconomic History   Marital status: Single    Spouse name: Not on file   Number of children: Not on file   Years of education: Not on file   Highest education level: Not on file  Occupational History   Not on file  Tobacco Use   Smoking status: Former    Types: Cigars    Quit date: 04/16/2016    Years since quitting: 5.5    Passive exposure: Past   Smokeless tobacco: Never  Vaping Use   Vaping Use: Some days  Substance and Sexual Activity   Alcohol use: Yes    Comment: 6 pack 2-3 times a month.    Drug use: No   Sexual activity: Yes    Birth control/protection: None  Other Topics Concern   Not on file  Social History Narrative   Not on file   Social Determinants of Health   Financial Resource Strain: Not on file  Food Insecurity: Not on file  Transportation Needs: Not on file  Physical Activity: Not on file  Stress: Not on file  Social Connections: Not on file  Intimate Partner Violence: Not on file    Past Surgical History:  Procedure Laterality Date   NO PAST SURGERIES      Family History  Problem Relation Age of Onset   Allergic rhinitis Mother    Healthy Mother    Diabetes Father    Hypertension Father    Healthy Sister    Allergic rhinitis Son    Healthy Son     Allergies  Allergen Reactions   Clonidine Derivatives Swelling    Facial swelling.    Current Outpatient Medications on File Prior to Visit  Medication Sig Dispense Refill   amLODipine (NORVASC) 10 MG tablet Take 1 tablet (10 mg total) by mouth daily. 90 tablet 0   atorvastatin (LIPITOR) 10 MG tablet Take 1 tablet (10 mg total) by mouth daily. 30 tablet 11   chlorthalidone (HYGROTON) 25 MG tablet Take 1 tablet (25 mg  total) by mouth daily. 90 tablet 0   famotidine (PEPCID) 20 MG tablet Take 1 tablet (20 mg total) by mouth 2 (two) times daily. 30 tablet 5   levocetirizine (XYZAL) 5 MG tablet Take 1 tablet (5 mg total) by mouth 2 (two) times daily. 60 tablet 5   metFORMIN (GLUCOPHAGE) 500 MG tablet Take 1 tablet (500 mg total) by mouth daily with breakfast. 30 tablet 3   metoprolol succinate (TOPROL-XL) 50 MG 24 hr tablet Take 1 tablet (50 mg total) by mouth daily. Take with or immediately following a meal. 30 tablet 3   Olopatadine HCl 0.2 % SOLN Apply 1 drop to eye daily. 2.5 mL 5   Olopatadine-Mometasone (RYALTRIS) 665-25 MCG/ACT SUSP Place 2 sprays into the nose in the morning and at bedtime. 29 g 5   triamcinolone cream (KENALOG) 0.1 % Apply 1 application. topically 2 (two) times daily. 30 g  5   Vitamin D, Ergocalciferol, (DRISDOL) 1.25 MG (50000 UNIT) CAPS capsule Take 50,000 Units by mouth once a week.     No current facility-administered medications on file prior to visit.    BP 120/70   Pulse 76   Resp 18   Ht 6\' 1"  (1.854 m)   Wt (!) 378 lb (171.5 kg)   SpO2 95%   BMI 49.87 kg/m             Objective:   Physical Exam  General Mental Status- Alert. General Appearance- Not in acute distress.   Skin General: Color- Normal Color. Moisture- Normal Moisture.  Neck Carotid Arteries- Normal color. Moisture- Normal Moisture. No carotid bruits. No JVD.  Chest and Lung Exam Auscultation: Breath Sounds:-Normal.  Cardiovascular Auscultation:Rythm- Regular. Murmurs & Other Heart Sounds:Auscultation of the heart reveals- No Murmurs.  Abdomen Inspection:-Inspeection Normal. Palpation/Percussion:Note:No mass. Palpation and Percussion of the abdomen reveal- Non Tender, Non Distended + BS, no rebound or guarding.  Neurologic Cranial Nerve exam:- CN III-XII intact(No nystagmus), symmetric smile. Strength:- 5/5 equal and symmetric strength both upper and lower extremities.       Assessment & Plan:   Patient Instructions  For you wellness exam today I have ordered cbc, cmp and  lipid panel.  Up to date on vaccines  Recommend exercise and healthy diet.  We will let you know lab results as they come in.  Follow up date appointment will be determined after lab review.    Obesity- discussed weight loss and his failed efforts. Currently on metformin. Counseled on wegovy or ozempic if he decided he wants to try benefit, risk and contraindications discussed.  Refer for evaluation for screening colonoscopy based on family history.         , Esperanza Richters   [place] as did address obesity and referred early for colonosocpy evaluation due to family history.(Note may soon be presicbing wegovy or ozempic if pt in agreement/wants)

## 2021-11-01 ENCOUNTER — Encounter: Payer: Self-pay | Admitting: Medical

## 2021-11-01 LAB — CBC WITH DIFFERENTIAL/PLATELET
Absolute Monocytes: 746 cells/uL (ref 200–950)
Basophils Absolute: 84 cells/uL (ref 0–200)
Basophils Relative: 0.8 %
Eosinophils Absolute: 410 cells/uL (ref 15–500)
Eosinophils Relative: 3.9 %
HCT: 46.9 % (ref 38.5–50.0)
Hemoglobin: 15.3 g/dL (ref 13.2–17.1)
Lymphs Abs: 1932 cells/uL (ref 850–3900)
MCH: 28.2 pg (ref 27.0–33.0)
MCHC: 32.6 g/dL (ref 32.0–36.0)
MCV: 86.4 fL (ref 80.0–100.0)
MPV: 13.4 fL — ABNORMAL HIGH (ref 7.5–12.5)
Monocytes Relative: 7.1 %
Neutro Abs: 7329 cells/uL (ref 1500–7800)
Neutrophils Relative %: 69.8 %
Platelets: 218 10*3/uL (ref 140–400)
RBC: 5.43 10*6/uL (ref 4.20–5.80)
RDW: 13.1 % (ref 11.0–15.0)
Total Lymphocyte: 18.4 %
WBC: 10.5 10*3/uL (ref 3.8–10.8)

## 2021-11-01 LAB — LIPID PANEL
Cholesterol: 173 mg/dL (ref <200)
HDL: 41 mg/dL (ref >=40)
LDL Cholesterol (Calc): 116 mg/dL (calc) — ABNORMAL HIGH
Non-HDL Cholesterol (Calc): 132 mg/dL (calc) — ABNORMAL HIGH (ref <130)
Total CHOL/HDL Ratio: 4.2 (calc) (ref <5.0)
Triglycerides: 66 mg/dL (ref <150)

## 2021-11-01 LAB — COMPREHENSIVE METABOLIC PANEL
AG Ratio: 1.3 (calc) (ref 1.0–2.5)
ALT: 26 U/L (ref 9–46)
AST: 19 U/L (ref 10–40)
Albumin: 4.3 g/dL (ref 3.6–5.1)
Alkaline phosphatase (APISO): 83 U/L (ref 36–130)
BUN: 11 mg/dL (ref 7–25)
CO2: 28 mmol/L (ref 20–32)
Calcium: 9.7 mg/dL (ref 8.6–10.3)
Chloride: 102 mmol/L (ref 98–110)
Creat: 1.21 mg/dL (ref 0.60–1.29)
Globulin: 3.4 g/dL (calc) (ref 1.9–3.7)
Glucose, Bld: 68 mg/dL (ref 65–99)
Potassium: 4.1 mmol/L (ref 3.5–5.3)
Sodium: 139 mmol/L (ref 135–146)
Total Bilirubin: 1 mg/dL (ref 0.2–1.2)
Total Protein: 7.7 g/dL (ref 6.1–8.1)

## 2021-11-01 LAB — HEMOGLOBIN A1C
Hgb A1c MFr Bld: 6 % of total Hgb — ABNORMAL HIGH (ref <5.7)
Mean Plasma Glucose: 126 mg/dL
eAG (mmol/L): 7 mmol/L

## 2021-11-03 MED ORDER — SEMAGLUTIDE-WEIGHT MANAGEMENT 0.25 MG/0.5ML ~~LOC~~ SOAJ: SUBCUTANEOUS | 0 refills | Status: DC

## 2021-11-03 NOTE — Addendum Note (Signed)
Addended by: Gwenevere Abbot on: 11/03/2021 08:25 PM   Modules accepted: Orders

## 2021-11-20 ENCOUNTER — Ambulatory Visit: Payer: BC Managed Care – PPO | Admitting: Allergy

## 2021-12-09 ENCOUNTER — Other Ambulatory Visit: Payer: Self-pay | Admitting: Medical

## 2022-01-15 ENCOUNTER — Ambulatory Visit: Payer: BC Managed Care – PPO | Admitting: Allergy

## 2022-01-15 DIAGNOSIS — J309 Allergic rhinitis, unspecified: Secondary | ICD-10-CM

## 2022-05-18 ENCOUNTER — Ambulatory Visit: Payer: BC Managed Care – PPO | Admitting: Medical

## 2022-05-18 ENCOUNTER — Encounter: Payer: Self-pay | Admitting: Medical

## 2022-05-18 VITALS — BP 160/90 | HR 86 | Temp 98.0°F | Resp 18 | Ht 73.0 in | Wt 375.0 lb

## 2022-05-18 DIAGNOSIS — R5383 Other fatigue: Secondary | ICD-10-CM | POA: Diagnosis not present

## 2022-05-18 DIAGNOSIS — E785 Hyperlipidemia, unspecified: Secondary | ICD-10-CM | POA: Diagnosis not present

## 2022-05-18 DIAGNOSIS — I10 Essential (primary) hypertension: Secondary | ICD-10-CM | POA: Diagnosis not present

## 2022-05-18 DIAGNOSIS — R739 Hyperglycemia, unspecified: Secondary | ICD-10-CM

## 2022-05-18 LAB — COMPREHENSIVE METABOLIC PANEL
ALT: 21 U/L (ref 0–53)
AST: 15 U/L (ref 0–37)
Albumin: 4 g/dL (ref 3.5–5.2)
Alkaline Phosphatase: 85 U/L (ref 39–117)
BUN: 17 mg/dL (ref 6–23)
CO2: 28 mEq/L (ref 19–32)
Calcium: 9 mg/dL (ref 8.4–10.5)
Chloride: 106 mEq/L (ref 96–112)
Creatinine, Ser: 1.26 mg/dL (ref 0.40–1.50)
GFR: 71.33 mL/min (ref >60.00)
Glucose, Bld: 83 mg/dL (ref 70–99)
Potassium: 4 mEq/L (ref 3.5–5.1)
Sodium: 142 mEq/L (ref 135–145)
Total Bilirubin: 0.5 mg/dL (ref 0.2–1.2)
Total Protein: 7 g/dL (ref 6.0–8.3)

## 2022-05-18 LAB — LIPID PANEL
Cholesterol: 200 mg/dL (ref 0–200)
HDL: 39.1 mg/dL (ref >39.00)
LDL Cholesterol: 149 mg/dL — ABNORMAL HIGH (ref 0–99)
NonHDL: 161.24
Total CHOL/HDL Ratio: 5
Triglycerides: 63 mg/dL (ref 0.0–149.0)
VLDL: 12.6 mg/dL (ref 0.0–40.0)

## 2022-05-18 LAB — VITAMIN B12: Vitamin B-12: 456 pg/mL (ref 211–911)

## 2022-05-18 LAB — CBC WITH DIFFERENTIAL/PLATELET
Basophils Absolute: 0.1 10*3/uL (ref 0.0–0.1)
Basophils Relative: 1.1 % (ref 0.0–3.0)
Eosinophils Absolute: 0.2 10*3/uL (ref 0.0–0.7)
Eosinophils Relative: 3.3 % (ref 0.0–5.0)
HCT: 43.8 % (ref 39.0–52.0)
Hemoglobin: 14.5 g/dL (ref 13.0–17.0)
Lymphocytes Relative: 21.2 % (ref 12.0–46.0)
Lymphs Abs: 1.5 10*3/uL (ref 0.7–4.0)
MCHC: 33 g/dL (ref 30.0–36.0)
MCV: 86.4 fl (ref 78.0–100.0)
Monocytes Absolute: 0.7 10*3/uL (ref 0.1–1.0)
Monocytes Relative: 10.1 % (ref 3.0–12.0)
Neutro Abs: 4.5 10*3/uL (ref 1.4–7.7)
Neutrophils Relative %: 64.3 % (ref 43.0–77.0)
Platelets: 199 10*3/uL (ref 150.0–400.0)
RBC: 5.07 Mil/uL (ref 4.22–5.81)
RDW: 13.8 % (ref 11.5–15.5)
WBC: 7 10*3/uL (ref 4.0–10.5)

## 2022-05-18 LAB — HEMOGLOBIN A1C: Hgb A1c MFr Bld: 6.4 % (ref 4.6–6.5)

## 2022-05-18 LAB — TSH: TSH: 2.1 u[IU]/mL (ref 0.35–5.50)

## 2022-05-18 MED ORDER — AMLODIPINE BESYLATE 10 MG PO TABS: 10.0000 mg | ORAL_TABLET | Freq: Every day | ORAL | 3 refills | Status: DC

## 2022-05-18 MED ORDER — SEMAGLUTIDE-WEIGHT MANAGEMENT 0.25 MG/0.5ML ~~LOC~~ SOAJ: SUBCUTANEOUS | 0 refills | Status: DC

## 2022-05-18 MED ORDER — CHLORTHALIDONE 25 MG PO TABS: 25.0000 mg | ORAL_TABLET | Freq: Every day | ORAL | 3 refills | Status: DC

## 2022-05-18 MED ORDER — ATORVASTATIN CALCIUM 10 MG PO TABS: 10.0000 mg | ORAL_TABLET | Freq: Every day | ORAL | 3 refills | Status: DC

## 2022-05-18 MED ORDER — METOPROLOL SUCCINATE ER 50 MG PO TB24: ORAL_TABLET | ORAL | 3 refills | Status: DC

## 2022-05-18 NOTE — Progress Notes (Signed)
Subjective:    Patient ID: Wesley Obrien, male    DOB: 1981/03/10, 42 y.o.   MRN: [id number]  HPI  Htn- pt was on amlodipine, chlorthalidone and toprol xl 50 mg daily. Pt states he has not been taking med for 2 months. He states he feels well. He admits he is being hard headed.   Pt states first time his blood pressure was high here then on recheck was better. Machine got very high reading  upper arm wit machine then on second check lower reading on forearm.   No cardiac or neurologic signs or symptoms.  High cholesterol- pt on atorvastatin.  Obesity- pt could not get wegovy. Insurance told him needed to be mail order pharmacy.  Some chronic bilateral ankle swelling.   Review of Systems  Constitutional:  Positive for fatigue. Negative for chills and fever.  HENT:  Negative for congestion and drooling.   Respiratory:  Negative for cough, chest tightness, shortness of breath and wheezing.   Cardiovascular:  Negative for chest pain and palpitations.  Gastrointestinal:  Negative for abdominal pain, blood in stool, constipation and vomiting.  Genitourinary:  Negative for dysuria and frequency.  Musculoskeletal:  Negative for back pain, joint swelling and neck stiffness.  Skin:  Negative for rash.  Neurological:  Negative for dizziness, syncope, numbness and headaches.  Hematological:  Negative for adenopathy. Does not bruise/bleed easily.  Psychiatric/Behavioral:  Negative for behavioral problems and decreased concentration. The patient is not nervous/anxious.        Past Medical History:  Diagnosis Date   Angio-edema    History of chicken pox    Hypertension    Urticaria      Social History   Socioeconomic History   Marital status: Single    Spouse name: Not on file   Number of children: Not on file   Years of education: Not on file   Highest education level: Not on file  Occupational History   Not on file  Tobacco Use   Smoking status: Former    Types: Cigars     Quit date: 04/16/2016    Years since quitting: 6.0    Passive exposure: Past   Smokeless tobacco: Never  Vaping Use   Vaping Use: Some days  Substance and Sexual Activity   Alcohol use: Yes    Comment: 6 pack 2-3 times a month.   Drug use: No   Sexual activity: Yes    Birth control/protection: None  Other Topics Concern   Not on file  Social History Narrative   Not on file   Social Determinants of Health   Financial Resource Strain: Not on file  Food Insecurity: Not on file  Transportation Needs: Not on file  Physical Activity: Not on file  Stress: Not on file  Social Connections: Not on file  Intimate Partner Violence: Not on file    Past Surgical History:  Procedure Laterality Date   NO PAST SURGERIES      Family History  Problem Relation Age of Onset   Allergic rhinitis Mother    Healthy Mother    Diabetes Father    Hypertension Father    Healthy Sister    Allergic rhinitis Son    Healthy Son     Allergies  Allergen Reactions   Clonidine Derivatives Swelling    Facial swelling.    Current Outpatient Medications on File Prior to Visit  Medication Sig Dispense Refill   atorvastatin (LIPITOR) 10 MG tablet Take 1 tablet (10  mg total) by mouth daily. 30 tablet 11   famotidine (PEPCID) 20 MG tablet Take 1 tablet (20 mg total) by mouth 2 (two) times daily. 30 tablet 5   levocetirizine (XYZAL) 5 MG tablet Take 1 tablet (5 mg total) by mouth 2 (two) times daily. 60 tablet 5   metFORMIN (GLUCOPHAGE) 500 MG tablet TAKE 1 TABLET BY MOUTH DAILY  WITH BREAKFAST 90 tablet 3   Olopatadine HCl 0.2 % SOLN Apply 1 drop to eye daily. 2.5 mL 5   Olopatadine-Mometasone (RYALTRIS) 665-25 MCG/ACT SUSP Place 2 sprays into the nose in the morning and at bedtime. 29 g 5   triamcinolone cream (KENALOG) 0.1 % Apply 1 application. topically 2 (two) times daily. 30 g 5   Vitamin D, Ergocalciferol, (DRISDOL) 1.25 MG (50000 UNIT) CAPS capsule Take 50,000 Units by mouth once a week.      No current facility-administered medications on file prior to visit.    BP (!) 160/90 Comment: checked twice other time 160/98  Pulse 86   Temp 98 F (36.7 C)   Resp 18   Ht 6\' 1"  (1.854 m)   Wt (!) 375 lb (170.1 kg)   SpO2 96%   BMI 49.48 kg/m           Objective:   Physical Exam  General Mental Status- Alert. General Appearance- Not in acute distress.   Skin General: Color- Normal Color. Moisture- Normal Moisture.  Neck Carotid Arteries- Normal color. Moisture- Normal Moisture. No carotid bruits. No JVD.  Chest and Lung Exam Auscultation: Breath Sounds:-Normal.  Cardiovascular Auscultation:Rythm- Regular. Murmurs & Other Heart Sounds:Auscultation of the heart reveals- No Murmurs.  Abdomen Inspection:-Inspeection Normal. Palpation/Percussion:Note:No mass. Palpation and Percussion of the abdomen reveal- Non Tender, Non Distended + BS, no rebound or guarding.   Neurologic Cranial Nerve exam:- CN III-XII intact(No nystagmus), symmetric smile. Drift Test:- No drift. Romberg Exam:- Negative.  Heal to Toe Gait exam:-Normal. Finger to Nose:- Normal/Intact Strength:- 5/5 equal and symmetric strength both upper and lower extremities.   Lower ext- no obvious pedal edema. Negative homans signs.    Assessment & Plan:   Patient Instructions  Htn- please start back on your 3 bp med regimen. You report having some supply of all 3 meds at home. Please restart. Send refills to your [facility].  High cholesterol- rx atorvastatin. Get lipid panel today with cmp.  For elevated sugar get A1c.  For ankle edema. Do think dependant edema. Advise use compression socks daily and elevate legs.  For fatigue added cbc, tsh, b12 and b1.   For wt loss rx wegovy. Rx advisement given. Discussed benefit vs risk of med.  Follow up in 2 weeks nurse bp check. Follow up date with me to be determined after lab review.     Mackie Pai, PA-C

## 2022-05-18 NOTE — Patient Instructions (Addendum)
Htn- please start back on your 3 bp med regimen. You report having some supply of all 3 meds at home. Please restart. Send refills to your [facility].  High cholesterol- rx atorvastatin. Get lipid panel today with cmp.  For elevated sugar get A1c.  For ankle edema. Do think dependant edema. Advise use compression socks daily and elevate legs.  For fatigue added cbc, tsh, b12 and b1.   For wt loss rx wegovy. Rx advisement given. Discussed benefit vs risk of med.  Follow up in 2 weeks nurse bp check. Follow up date with me to be determined after lab review.

## 2022-05-21 LAB — VITAMIN B1: Vitamin B1 (Thiamine): 32 nmol/L — ABNORMAL HIGH (ref 8–30)

## 2022-06-01 ENCOUNTER — Other Ambulatory Visit: Payer: Self-pay | Admitting: Medical

## 2022-06-01 ENCOUNTER — Ambulatory Visit: Payer: BC Managed Care – PPO | Admitting: Medical

## 2022-06-01 VITALS — BP 160/100 | HR 92 | Temp 98.2°F | Resp 18 | Ht 73.0 in | Wt 379.8 lb

## 2022-06-01 DIAGNOSIS — E785 Hyperlipidemia, unspecified: Secondary | ICD-10-CM

## 2022-06-01 DIAGNOSIS — R0683 Snoring: Secondary | ICD-10-CM

## 2022-06-01 DIAGNOSIS — I10 Essential (primary) hypertension: Secondary | ICD-10-CM | POA: Diagnosis not present

## 2022-06-01 MED ORDER — AMLODIPINE BESYLATE 10 MG PO TABS: 10.0000 mg | ORAL_TABLET | Freq: Every day | ORAL | 0 refills | Status: DC

## 2022-06-01 MED ORDER — METOPROLOL SUCCINATE ER 50 MG PO TB24: ORAL_TABLET | ORAL | 0 refills | Status: DC

## 2022-06-01 MED ORDER — CHLORTHALIDONE 25 MG PO TABS: 25.0000 mg | ORAL_TABLET | Freq: Every day | ORAL | 0 refills | Status: DC

## 2022-06-01 NOTE — Patient Instructions (Addendum)
Htn- bp is high today but you are not on your bp med for 3 days. Issues with mail order. Sent local refill of meds. Please follow up with mail order.  Check bp daily and give me update in 7 days.  High cholesterol- pt on atorvastatin.  Elevated sugar- on lab in prediabetic range. Continue metformin.  Follow up appointment with me  to be determined after you give me   bp update in 7 days by my chart.

## 2022-06-01 NOTE — Progress Notes (Signed)
Subjective:    Patient ID: Wesley Obrien, male    DOB: 02/03/1981, 42 y.o.   MRN: [id number]  HPI  Pt in for high blood pressure. He was taking his bp meds up until Friday but then he left his bp meds at work. He has not taken bp med for 3 days. He was on amlodipine 10 mg daily, chlorthalidone 25 mg  and toprol xl 50 mg daily.  He was taking bp med daily but he was not checking his bp .  No cardiac or neurologic signs or symptoms.   High cholesterol- pt on atorvastatin.   Elevated sugar- on lab in prediabetic range.  Obesity- I had rx'd wegovy recently.  Review of Systems  Constitutional:  Positive for fatigue. Negative for chills and diaphoresis.  HENT:  Negative for congestion, drooling, ear pain and facial swelling.   Respiratory:  Negative for cough, chest tightness, shortness of breath and wheezing.        Snore. Pt admits has for years.  Cardiovascular:  Negative for chest pain and palpitations.  Gastrointestinal:  Negative for abdominal pain, blood in stool, constipation, diarrhea, nausea and vomiting.  Genitourinary:  Negative for dysuria, flank pain and frequency.  Musculoskeletal:  Negative for back pain, joint swelling, neck pain and neck stiffness.  Skin:  Negative for color change and rash.  Neurological:  Negative for tremors, seizures, facial asymmetry, weakness and headaches.  Hematological:  Negative for adenopathy. Does not bruise/bleed easily.  Psychiatric/Behavioral:  Negative for behavioral problems, decreased concentration, hallucinations and sleep disturbance. The patient is not nervous/anxious and is not hyperactive.     Past Medical History:  Diagnosis Date   Angio-edema    History of chicken pox    Hypertension    Urticaria      Social History   Socioeconomic History   Marital status: Single    Spouse name: Not on file   Number of children: Not on file   Years of education: Not on file   Highest education level: Not on file  Occupational  History   Not on file  Tobacco Use   Smoking status: Former    Types: Cigars    Quit date: 04/16/2016    Years since quitting: 6.1    Passive exposure: Past   Smokeless tobacco: Never  Vaping Use   Vaping Use: Some days  Substance and Sexual Activity   Alcohol use: Yes    Comment: 6 pack 2-3 times a month.   Drug use: No   Sexual activity: Yes    Birth control/protection: None  Other Topics Concern   Not on file  Social History Narrative   Not on file   Social Determinants of Health   Financial Resource Strain: Not on file  Food Insecurity: Not on file  Transportation Needs: Not on file  Physical Activity: Not on file  Stress: Not on file  Social Connections: Not on file  Intimate Partner Violence: Not on file    Past Surgical History:  Procedure Laterality Date   NO PAST SURGERIES      Family History  Problem Relation Age of Onset   Allergic rhinitis Mother    Healthy Mother    Diabetes Father    Hypertension Father    Healthy Sister    Allergic rhinitis Son    Healthy Son     Allergies  Allergen Reactions   Clonidine Derivatives Swelling    Facial swelling.    Current Outpatient Medications on File  Prior to Visit  Medication Sig Dispense Refill   amLODipine (NORVASC) 10 MG tablet Take 1 tablet (10 mg total) by mouth daily. 90 tablet 3   atorvastatin (LIPITOR) 10 MG tablet Take 1 tablet (10 mg total) by mouth daily. 90 tablet 3   chlorthalidone (HYGROTON) 25 MG tablet Take 1 tablet (25 mg total) by mouth daily. 90 tablet 3   famotidine (PEPCID) 20 MG tablet Take 1 tablet (20 mg total) by mouth 2 (two) times daily. 30 tablet 5   levocetirizine (XYZAL) 5 MG tablet Take 1 tablet (5 mg total) by mouth 2 (two) times daily. 60 tablet 5   metFORMIN (GLUCOPHAGE) 500 MG tablet TAKE 1 TABLET BY MOUTH DAILY  WITH BREAKFAST 90 tablet 3   metoprolol succinate (TOPROL-XL) 50 MG 24 hr tablet TAKE 1 TABLET BY MOUTH DAILY  WITH OR IMMEDIATELY FOLLOWING A  MEAL 90 tablet  3   Olopatadine HCl 0.2 % SOLN Apply 1 drop to eye daily. 2.5 mL 5   Olopatadine-Mometasone (RYALTRIS) 665-25 MCG/ACT SUSP Place 2 sprays into the nose in the morning and at bedtime. 29 g 5   Semaglutide-Weight Management 0.25 MG/0.5ML SOAJ 0.25 mg injection weekly. 2 mL 0   triamcinolone cream (KENALOG) 0.1 % Apply 1 application. topically 2 (two) times daily. 30 g 5   Vitamin D, Ergocalciferol, (DRISDOL) 1.25 MG (50000 UNIT) CAPS capsule Take 50,000 Units by mouth once a week.     No current facility-administered medications on file prior to visit.    BP (!) 180/128   Pulse 92   Temp 98.2 F (36.8 C)   Resp 18   Ht 6' 1"$  (1.854 m)   Wt (!) 379 lb 12.8 oz (172.3 kg)   SpO2 98%   BMI 50.11 kg/m        Objective:   Physical Exam  General Mental Status- Alert. General Appearance- Not in acute distress.   Skin General: Color- Normal Color. Moisture- Normal Moisture.  Neck Carotid Arteries- Normal color. Moisture- Normal Moisture. No carotid bruits. No JVD.  Chest and Lung Exam Auscultation: Breath Sounds:-Normal.  Cardiovascular Auscultation:Rythm- Regular. Murmurs & Other Heart Sounds:Auscultation of the heart reveals- No Murmurs.  Abdomen Inspection:-Inspeection Normal. Palpation/Percussion:Note:No mass. Palpation and Percussion of the abdomen reveal- Non Tender, Non Distended + BS, no rebound or guarding.    Neurologic  Cranial Nerve exam:- CN III-XII intact(No nystagmus), symmetric smile. Drift Test:- No drift. Romberg Exam:- Negative.  Heal to Toe Gait exam:-Normal. Finger to Nose:- Normal/Intact Strength:- 5/5 equal and symmetric strength both upper and lower extremities.       Assessment & Plan:   Patient Instructions  Htn- bp is high today but you are not on your bp med for 3 days. Issues with mail order. Sent local refill of meds. Please follow up with mail order.  Check bp daily and give me update in 7 days.  High cholesterol- pt on  atorvastatin.  Elevated sugar- on lab in prediabetic range. Continue metformin.  Follow up appointment with me  to be determined after you give me   bp update in 7 days by my chart.          Mackie Pai, PA-C

## 2022-06-16 ENCOUNTER — Institutional Professional Consult (permissible substitution): Payer: BC Managed Care – PPO | Admitting: Nurse Practitioner

## 2022-06-19 ENCOUNTER — Institutional Professional Consult (permissible substitution): Payer: BC Managed Care – PPO | Admitting: Adult Health

## 2022-06-23 ENCOUNTER — Ambulatory Visit: Payer: BC Managed Care – PPO | Admitting: Adult Health

## 2022-06-23 ENCOUNTER — Encounter: Payer: Self-pay | Admitting: Adult Health

## 2022-06-23 VITALS — BP 118/100 | HR 84 | Temp 98.0°F | Ht 72.0 in | Wt 373.4 lb

## 2022-06-23 DIAGNOSIS — R0683 Snoring: Secondary | ICD-10-CM | POA: Insufficient documentation

## 2022-06-23 NOTE — Patient Instructions (Addendum)
Set up for home sleep study  Work on healthy weight loss  Do not drive if sleepy  Follow up in 6 weeks to discuss test results and treatment plan

## 2022-06-23 NOTE — Assessment & Plan Note (Signed)
Snoring, daytime fatigue, BMI 50 all suspicious for underlying sleep apnea.  Will set patient up for home sleep study.  Patient occasional sleep apnea given healthy sleep regimen written reviewed.  - discussed how weight can impact sleep and risk for sleep disordered breathing - discussed options to assist with weight loss: combination of diet modification, cardiovascular and strength training exercises   - had an extensive discussion regarding the adverse health consequences related to untreated sleep disordered breathing - specifically discussed the risks for hypertension, coronary artery disease, cardiac dysrhythmias, cerebrovascular disease, and diabetes - lifestyle modification discussed   - discussed how sleep disruption can increase risk of accidents, particularly when driving - safe driving practices were discussed   Plan  . Patient Instructions  Set up for home sleep study  Work on healthy weight loss  Do not drive if sleepy  Follow up in 6 weeks to discuss test results and treatment plan

## 2022-06-23 NOTE — Addendum Note (Signed)
Addended by: Vanessa Barbara on: 06/23/2022 04:16 PM   Modules accepted: Orders

## 2022-06-23 NOTE — Progress Notes (Signed)
Had the initial 2 Pfizer vaccines.  No flu vaccines.

## 2022-06-23 NOTE — Assessment & Plan Note (Signed)
Healthy weight loss discussed 

## 2022-06-23 NOTE — Progress Notes (Signed)
Reviewed and agree with assessment/plan.   Chesley Mires, MD [facility] Pulmonary/Critical Care 06/23/2022, 5:31 PM Pager:  [phone]

## 2022-06-23 NOTE — Progress Notes (Signed)
$'@Patient'z$  ID: Wesley Obrien, male    DOB: 1980-08-16, 42 y.o.   MRN: [id number]  Chief Complaint  Patient presents with   Consult    Referring provider: Elise Benne  HPI: 42 year old male seen for sleep consult March 2024 for snoring and daytime sleepiness Medical significant for morbid obesity, hypertension, prediabetes  TEST/EVENTS :   06/23/2022 Sleep consult  Patient presents for sleep consult today.  Kindly referred by primary care provider Mackie Pai, PA .  Patient complains of very loud snoring and daytime sleepiness. Can fall asleep early /easily. Complains of low energy and fatigue.  Goes to bed around 9-12 pm . Goes to bed in 5 minutes. Gets up at 6 am. No previous sleep study . Caffeine 1 cup day  No history CVA or CHF . No symptoms suspicious for Cataplexy or sleep paralysis . No sleep aides. Epworth score is 6 out 24 , typically get tired with watching TV and in the evening .  No removeable dental work. Sleeps on couch a lot. Falls asleep to TV .  Weight has been down just a little bit current weight at 373 pounds with a BMI of 50.   Social history patient is married.  Works as a Education officer, community.  Has 2 children.  He vapes occasionally.  Drinks very little socially.  No drug use.  Family history positive for colon cancer and chronic allergies    Allergies  Allergen Reactions   Clonidine Derivatives Swelling    Facial swelling.     There is no immunization history on file for this patient.  Past Medical History:  Diagnosis Date   Angio-edema    History of chicken pox    Hypertension    Urticaria     Tobacco History: Social History   Tobacco Use  Smoking Status Former   Types: Cigars   Quit date: 04/16/2016   Years since quitting: 6.1   Passive exposure: Past  Smokeless Tobacco Never   Counseling given: Not Answered   Outpatient Medications Prior to Visit  Medication Sig Dispense Refill   amLODipine (NORVASC) 10 MG tablet Take 1  tablet (10 mg total) by mouth daily. 30 tablet 0   atorvastatin (LIPITOR) 10 MG tablet Take 1 tablet (10 mg total) by mouth daily. 90 tablet 3   chlorthalidone (HYGROTON) 25 MG tablet Take 1 tablet (25 mg total) by mouth daily. 30 tablet 0   levocetirizine (XYZAL) 5 MG tablet Take 1 tablet (5 mg total) by mouth 2 (two) times daily. (Patient taking differently: Take 5 mg by mouth 2 (two) times daily. As needed for seasonal allergies.) 60 tablet 5   metFORMIN (GLUCOPHAGE) 500 MG tablet TAKE 1 TABLET BY MOUTH DAILY  WITH BREAKFAST 90 tablet 3   metoprolol succinate (TOPROL-XL) 50 MG 24 hr tablet TAKE 1 TABLET BY MOUTH DAILY  WITH OR IMMEDIATELY FOLLOWING A  MEAL 30 tablet 0   Olopatadine-Mometasone (RYALTRIS) 665-25 MCG/ACT SUSP Place 2 sprays into the nose in the morning and at bedtime. (Patient taking differently: Place 2 sprays into the nose in the morning and at bedtime. As needed) 29 g 5   famotidine (PEPCID) 20 MG tablet Take 1 tablet (20 mg total) by mouth 2 (two) times daily. (Patient not taking: Reported on 06/23/2022) 30 tablet 5   Olopatadine HCl 0.2 % SOLN Apply 1 drop to eye daily. (Patient not taking: Reported on 06/23/2022) 2.5 mL 5   Semaglutide-Weight Management 0.25 MG/0.5ML SOAJ 0.25 mg injection weekly. (  Patient not taking: Reported on 06/23/2022) 2 mL 0   Vitamin D, Ergocalciferol, (DRISDOL) 1.25 MG (50000 UNIT) CAPS capsule Take 50,000 Units by mouth once a week. (Patient not taking: Reported on 06/23/2022)     triamcinolone cream (KENALOG) 0.1 % Apply 1 application. topically 2 (two) times daily. (Patient not taking: Reported on 06/23/2022) 30 g 5   No facility-administered medications prior to visit.     Review of Systems:   Constitutional:   No  weight loss, night sweats,  Fevers, chills,  +fatigue, or  lassitude.  HEENT:   No headaches,  Difficulty swallowing,  Tooth/dental problems, or  Sore throat,                No sneezing, itching, ear ache, nasal congestion, post nasal drip,    CV:  No chest pain,  Orthopnea, PND, swelling in lower extremities, anasarca, dizziness, palpitations, syncope.   GI  No heartburn, indigestion, abdominal pain, nausea, vomiting, diarrhea, change in bowel habits, loss of appetite, bloody stools.   Resp: No shortness of breath with exertion or at rest.  No excess mucus, no productive cough,  No non-productive cough,  No coughing up of blood.  No change in color of mucus.  No wheezing.  No chest wall deformity  Skin: no rash or lesions.  GU: no dysuria, change in color of urine, no urgency or frequency.  No flank pain, no hematuria   MS:  No joint pain or swelling.  No decreased range of motion.  No back pain.    Physical Exam  BP (!) 118/100 (BP Location: Left Arm, Patient Position: Sitting, Cuff Size: Large)   Pulse 84   Temp 98 F (36.7 C) (Oral)   Ht 6' (1.829 m)   Wt (!) 373 lb 6.4 oz (169.4 kg)   SpO2 98%   BMI 50.64 kg/m   GEN: A/Ox3; pleasant , NAD, well nourished    HEENT:  [place]/AT,   NOSE-clear, THROAT-clear, no lesions, no postnasal drip or exudate noted. Class 3-4 MP airway   NECK:  Supple w/ fair ROM; no JVD; normal carotid impulses w/o bruits; no thyromegaly or nodules palpated; no lymphadenopathy.    RESP  Clear  P & A; w/o, wheezes/ rales/ or rhonchi. no accessory muscle use, no dullness to percussion  CARD:  RRR, no m/r/g, tr  peripheral edema, pulses intact, no cyanosis or clubbing.  GI:   Soft & nt; nml bowel sounds; no organomegaly or masses detected.   Musco: Warm bil, no deformities or joint swelling noted.   Neuro: alert, no focal deficits noted.    Skin: Warm, no lesions or rashes    Lab Results:  CBC    Component Value Date/Time   WBC 7.0 05/18/2022 0931   RBC 5.07 05/18/2022 0931   HGB 14.5 05/18/2022 0931   HGB 15.5 08/06/2021 1443   HCT 43.8 05/18/2022 0931   HCT 46.1 08/06/2021 1443   PLT 199.0 05/18/2022 0931   MCV 86.4 05/18/2022 0931   MCV 85 08/06/2021 1443   MCH 28.2  10/31/2021 1523   MCHC 33.0 05/18/2022 0931   RDW 13.8 05/18/2022 0931   RDW 13.0 08/06/2021 1443   LYMPHSABS 1.5 05/18/2022 0931   LYMPHSABS 1.8 08/06/2021 1443   MONOABS 0.7 05/18/2022 0931   EOSABS 0.2 05/18/2022 0931   EOSABS 0.3 08/06/2021 1443   BASOSABS 0.1 05/18/2022 0931   BASOSABS 0.0 08/06/2021 1443    BMET    Component Value Date/Time  NA 142 05/18/2022 0931   NA 142 08/06/2021 1443   K 4.0 05/18/2022 0931   CL 106 05/18/2022 0931   CO2 28 05/18/2022 0931   GLUCOSE 83 05/18/2022 0931   BUN 17 05/18/2022 0931   BUN 14 08/06/2021 1443   CREATININE 1.26 05/18/2022 0931   CREATININE 1.21 10/31/2021 1523   CALCIUM 9.0 05/18/2022 0931   GFRNONAA >60 01/13/2021 0055   GFRNONAA 62 05/20/2020 0000   GFRAA 72 05/20/2020 0000    BNP No results found for: "BNP"  ProBNP No results found for: "PROBNP"  Imaging: No results found.        No data to display          No results found for: "NITRICOXIDE"      Assessment & Plan:   Snoring Snoring, daytime fatigue, BMI 50 all suspicious for underlying sleep apnea.  Will set patient up for home sleep study.  Patient occasional sleep apnea given healthy sleep regimen written reviewed.  - discussed how weight can impact sleep and risk for sleep disordered breathing - discussed options to assist with weight loss: combination of diet modification, cardiovascular and strength training exercises   - had an extensive discussion regarding the adverse health consequences related to untreated sleep disordered breathing - specifically discussed the risks for hypertension, coronary artery disease, cardiac dysrhythmias, cerebrovascular disease, and diabetes - lifestyle modification discussed   - discussed how sleep disruption can increase risk of accidents, particularly when driving - safe driving practices were discussed   Plan  . Patient Instructions  Set up for home sleep study  Work on healthy weight loss  Do  not drive if sleepy  Follow up in 6 weeks to discuss test results and treatment plan     Morbid obesity ([place]) Healthy weight loss discussed     Rexene Edison, NP 06/23/2022

## 2022-07-16 ENCOUNTER — Telehealth: Payer: Self-pay | Admitting: Adult Health

## 2022-07-16 NOTE — Telephone Encounter (Signed)
I called patient they are aware that [facility] has there study pended so they do have it

## 2022-08-04 ENCOUNTER — Ambulatory Visit: Payer: BC Managed Care – PPO | Admitting: Adult Health

## 2023-03-22 ENCOUNTER — Ambulatory Visit: Payer: BC Managed Care – PPO | Admitting: Medical

## 2023-03-22 ENCOUNTER — Encounter: Payer: Self-pay | Admitting: Medical

## 2023-03-22 VITALS — BP 148/98 | HR 84 | Resp 16 | Ht 72.0 in | Wt 370.0 lb

## 2023-03-22 DIAGNOSIS — I10 Essential (primary) hypertension: Secondary | ICD-10-CM | POA: Insufficient documentation

## 2023-03-22 DIAGNOSIS — R0789 Other chest pain: Secondary | ICD-10-CM

## 2023-03-22 DIAGNOSIS — R0781 Pleurodynia: Secondary | ICD-10-CM

## 2023-03-22 DIAGNOSIS — R739 Hyperglycemia, unspecified: Secondary | ICD-10-CM

## 2023-03-22 DIAGNOSIS — Z125 Encounter for screening for malignant neoplasm of prostate: Secondary | ICD-10-CM

## 2023-03-22 DIAGNOSIS — E785 Hyperlipidemia, unspecified: Secondary | ICD-10-CM

## 2023-03-22 DIAGNOSIS — J40 Bronchitis, not specified as acute or chronic: Secondary | ICD-10-CM | POA: Diagnosis not present

## 2023-03-22 MED ORDER — HYDRALAZINE HCL 25 MG PO TABS: 25.0000 mg | ORAL_TABLET | Freq: Three times a day (TID) | ORAL | 0 refills | Status: DC

## 2023-03-22 NOTE — Patient Instructions (Addendum)
1. Hypertension, unspecified type Amlodipine 10 mg tab daily, chlorthalidone 25 mg daily, toprol xl 50 mg daily. Bp still high today and was higher at [facility]. Add on hydralazine 25 mg three times daily. Check bp daily and update me on Thursday morning on bp levels.  2. Rib pain UC rx prednisone. Pain seems to have improved after starting prednisone. Continue. If any rash or blister outbreak in rib area let me know.  3. Bronchitis -UC rx'd azithromycin. Can continue antibiotic. Cxr reviewed today. Saw impression in care everywhere.  4. Elevated blood sugar Will get A1c and cmp on follow up in 10 days. Continue metformin  5. Hyperlipidemia, unspecified hyperlipidemia type Continue  atorvastatin. Check lipid panel on follow up.  Follow up 10 days or sooner if needed.  Explained can get blood work earlier than 10 days.

## 2023-03-22 NOTE — Progress Notes (Signed)
Subjective:    Patient ID: Wesley Obrien, male    DOB: [birth year], 42 y.o.   MRN: [id number]  HPI  Pt in for follow up. Pt went to UC early today.  Reason Chest Pain Patient states that he feels like he pulled a muscle in his chest, patient states when he moves a certain way the pain takes his breath away. No meds on board.     Formatting of this note is different from the original. Images from the original note were not included. Wesley Obrien is a 42 y.o. with Active Ambulatory Problems Diagnosis Date Noted Snoring 06/23/2022 Morbid obesity with body mass index (BMI) of 45.0 to 49.9 in adult (CMS/HCC) 03/22/2023 Primary hypertension 03/22/2023 Moderate mixed hyperlipidemia not requiring statin therapy 03/22/2023 Prediabetes 03/22/2023  Resolved Ambulatory Problems Diagnosis Date Noted Crushing injury of left index finger 03/02/2016 Morbid obesity (CMD) 06/23/2022 Hyperlipidemia associated with type 2 diabetes mellitus (CMD) 03/22/2023  Past Medical History: Diagnosis Date Hypertension who presents today at Urgent Care for Chief Complaint Patient presents with Chest Pain Patient states that he feels like he pulled a muscle in his chest, patient states when he moves a certain way the pain takes his breath away. No meds on board.   42 y.o. male c/o left side chest pain upon waking this AM. He states that the pain is also wrapping around his side and is worse with talking and movement. He denies heavy lifting or injuries. He does have diabetes and hypertension. He admits that he did not take his medication for at least a month but has been back on it for the last week. He denies shortness of breath, dizziness, sweating, headaches. He does not monitor blood sugar levels. He denies recent illnesses. He does admit to vaping.  Chest Pain This is a new problem. The current episode started today. The onset quality is sudden. The problem occurs constantly. The problem has  been unchanged. The pain is present in the lateral region and substernal region. The pain is at a severity of 6/10. The pain is moderate. The quality of the pain is described as sharp and heavy. The pain radiates to the precordial region. Associated symptoms include malaise/fatigue. Pertinent negatives include no abdominal pain, back pain, claudication, cough, diaphoresis, dizziness, exertional chest pressure, fever, headaches, hemoptysis, irregular heartbeat, leg pain, lower extremity edema, nausea, near-syncope, numbness, orthopnea, palpitations, PND, shortness of breath, sputum production, syncope, vomiting or weakness. The pain is aggravated by movement. He has tried nothing for the symptoms. His past medical history is significant for diabetes, hyperlipidemia and hypertension. Pertinent negatives for past medical history include no aneurysm, no anxiety/panic attacks, no aortic aneurysm, no aortic dissection, no arrhythmia, no bicuspid aortic valve, no CAD, no cancer, no congenital heart disease, no connective tissue disease, no COPD, no CHF, no DVT, no hyperhomocysteinemia, no Kawasaki disease, no Marfan's syndrome, no MI, no mitral valve prolapse, no pacemaker, no PE, no PVD, no recent injury, no rheumatic fever, no seizures, no sickle cell disease, no sleep apnea, no spontaneous pneumothorax, no stimulant use, no strokes, no thyroid problem, no TIA, Turner syndrome and no valve disorder. His family medical history is significant for diabetes, heart disease, hyperlipidemia and hypertension.  reports that he has been smoking. He has never used smokeless tobacco.  Review of Systems Constitutional: Positive for malaise/fatigue. Negative for diaphoresis and fever. Respiratory: Negative for cough, hemoptysis, sputum production and shortness of breath. Cardiovascular: Positive for chest pain. Negative for palpitations, orthopnea, claudication,  syncope, PND and near-syncope. Gastrointestinal: Negative for  abdominal pain, nausea and vomiting. Musculoskeletal: Negative for back pain. Neurological: Negative for dizziness, seizures, weakness, numbness and headaches.  Physical Exam  BP (!) 180/132  Pulse 82  Temp 97.9 F (36.6 C) (Oral)  Resp 20  Ht 1.854 m (6\' 1" )  Wt (!) 159 kg (350 lb)  SpO2 95%  BMI 46.18 kg/m  Constitutional: General: Patient is not in acute distress. Appearance: Normal appearance. Neurological: General: No focal deficit present. Mental Status: alert and oriented to person, place, and time. Cardiovascular: Heart sounds: Normal heart sounds. No murmur heard. No Lower extremity edema noted Pulmonary: Effort: Pulmonary effort is normal. No respiratory distress. Breath sounds: bilateral lower lobe expiratory rhonchi Abdominal: General: Bowel sounds are normal. There is no distension. Tenderness: There is no abdominal tenderness. Musculoskeletal: General: Normal range of motion. Neck: No rigidity. Chest: no pain with palpation from sternum to trapezius Skin: General: Skin is warm and dry. Psychiatric: Mood and Affect: Mood normal.  XR Chest 2 Views Final Result by Vivia Ewing, MD (12/02 1610) XR CHEST 2 VIEWS, 03/22/2023 8:49 AM  INDICATION: Essential (primary) hypertension \ I10 Essential (primary) hypertension \ R07.9 Chest pain, unspecified COMPARISON: None  FINDINGS:  Cardiovascular: Cardiac silhouette is not enlarged. Mediastinum: Tortuous thoracic aorta. Lungs/pleura: Central peribronchial thickening. No pulmonary consolidation. No pleural effusion or pneumothorax. Upper abdomen: Visualized portions are unremarkable. Chest wall/osseous structures: No acute fracture.   IMPRESSION: Nonspecific mild central peribronchial thickening, although concerning for viral respiratory illness, or bronchitis-bronchiolitis. No pulmonary consolidation.   DIAGNOSIS/PLAN 1. Costochondritis, acute - XR Chest 2 Views - ECG 12 lead - POC Urinalysis  Auto without Microscopic - predniSONE (DELTASONE) 20 mg tablet; Take 2 tablets (40 mg total) by mouth daily for 5 days. Dispense: 10 tablet; Refill: 0  2. Bronchitis - azithromycin (ZITHROMAX) 250 mg tablet; Take 2 tabs day 1 then 1 tab days 2-5 Dispense: 6 tablet; Refill: 0 - predniSONE (DELTASONE) 20 mg tablet; Take 2 tablets (40 mg total) by mouth daily for 5 days. Dispense: 10 tablet; Refill: 0  3. Primary hypertension - XR Chest 2 Views - ECG 12 lead  4. Morbid obesity with body mass index (BMI) of 45.0 to 49.9 in adult (CMS/HCC)  5. Patient noncompliant with statin medication  6. Medically noncompliant   We discussed risks and side effects of medications, and also discussed red flags which would warrant immediate follow-up.   42 y.o. male c/o left side chest pain upon waking this AM. He states that the pain is also wrapping around his side and is worse with talking and movement. He denies heavy lifting or injuries. He does have diabetes and hypertension. He admits that he did not take his medication for at least a month but has been back on it for the last week. He denies shortness of breath, dizziness, sweating, headaches. He does not monitor blood sugar levels. He denies recent illnesses. He does admit to vaping. EKG is normal, chest xray shows peribronchial thickening which is likely from vaping though could be viral or bacterial. Urinalysis is normal. Unable to palpate pain but reproducible with talking, deep breathing and moving left arm. Treating with zpac, prednisone 40 mg daily x 5 days. Advised to stop vaping. Also advised to make sure that he keeps taking his prescribed medications for hypertension and prediabetes. Advised to follow up with pcp as well. He states that he has an appointment with pcp this afternoon which he will keep.  Pt now in office to follow up after UC evaluation. Pt states he has less pain. Now he states only has left lower rib pain if he bend  thorax to the left. Some pain in lower rib states near back area that he notes in rib area. He already took zpack tab and prednisone.  Htn- pt had been off his bp meds for a month before restarting it one week ago. Pt tooks  Amlodipine 10 mg tab daily, chlorthalidone 25 mg daily, toprol xl 50 mg daily.     Review of Systems  Constitutional:  Negative for chills, fatigue and fever.  Respiratory:  Negative for cough, chest tightness and wheezing.   Cardiovascular:  Negative for chest pain and palpitations.  Gastrointestinal:  Negative for abdominal pain.  Genitourinary:  Negative for dysuria and frequency.  Musculoskeletal:        Rib area pain left side.  Skin:  Negative for rash.  Neurological:  Negative for dizziness, weakness and headaches.  Hematological:  Negative for adenopathy. Does not bruise/bleed easily.  Psychiatric/Behavioral:  Negative for behavioral problems and decreased concentration.     Past Medical History:  Diagnosis Date   Angio-edema    History of chicken pox    Hypertension    Urticaria      Social History   Socioeconomic History   Marital status: Single    Spouse name: Not on file   Number of children: Not on file   Years of education: Not on file   Highest education level: Not on file  Occupational History   Not on file  Tobacco Use   Smoking status: Former    Types: Cigars    Quit date: 04/16/2016    Years since quitting: 6.9    Passive exposure: Past   Smokeless tobacco: Never  Vaping Use   Vaping status: Some Days  Substance and Sexual Activity   Alcohol use: Yes    Comment: 6 pack 2-3 times a month.   Drug use: No   Sexual activity: Yes    Birth control/protection: None  Other Topics Concern   Not on file  Social History Narrative   Not on file   Social Determinants of Health   Financial Resource Strain: Not on file  Food Insecurity: Not on file  Transportation Needs: Not on file  Physical Activity: Not on file  Stress: Not  on file  Social Connections: Not on file  Intimate Partner Violence: Not on file    Past Surgical History:  Procedure Laterality Date   NO PAST SURGERIES      Family History  Problem Relation Age of Onset   Allergic rhinitis Mother    Healthy Mother    Diabetes Father    Hypertension Father    Healthy Sister    Allergic rhinitis Son    Healthy Son     Allergies  Allergen Reactions   Clonidine Derivatives Swelling    Facial swelling.    Current Outpatient Medications on File Prior to Visit  Medication Sig Dispense Refill   amLODipine (NORVASC) 10 MG tablet Take 1 tablet (10 mg total) by mouth daily. 30 tablet 0   atorvastatin (LIPITOR) 10 MG tablet Take 1 tablet (10 mg total) by mouth daily. 90 tablet 3   chlorthalidone (HYGROTON) 25 MG tablet Take 1 tablet (25 mg total) by mouth daily. 30 tablet 0   metFORMIN (GLUCOPHAGE) 500 MG tablet TAKE 1 TABLET BY MOUTH DAILY  WITH BREAKFAST 90 tablet 3  metoprolol succinate (TOPROL-XL) 50 MG 24 hr tablet TAKE 1 TABLET BY MOUTH DAILY  WITH OR IMMEDIATELY FOLLOWING A  MEAL 30 tablet 0   Olopatadine-Mometasone (RYALTRIS) 665-25 MCG/ACT SUSP Place 2 sprays into the nose in the morning and at bedtime. (Patient taking differently: Place 2 sprays into the nose in the morning and at bedtime. As needed) 29 g 5   famotidine (PEPCID) 20 MG tablet Take 1 tablet (20 mg total) by mouth 2 (two) times daily. (Patient not taking: Reported on 06/23/2022) 30 tablet 5   levocetirizine (XYZAL) 5 MG tablet Take 1 tablet (5 mg total) by mouth 2 (two) times daily. (Patient not taking: Reported on 03/22/2023) 60 tablet 5   Semaglutide-Weight Management 0.25 MG/0.5ML SOAJ 0.25 mg injection weekly. (Patient not taking: Reported on 06/23/2022) 2 mL 0   No current facility-administered medications on file prior to visit.    BP (!) 148/98   Pulse 84   Resp 16   Ht 6' (1.829 m)   Wt (!) 370 lb (167.8 kg)   SpO2 95%   BMI 50.18 kg/m        Objective:    Physical Exam  General Mental Status- Alert. General Appearance- Not in acute distress.   Skin General: Color- Normal Color. Moisture- Normal Moisture.  Neck Carotid Arteries- Normal color. Moisture- Normal Moisture. No carotid bruits. No JVD.  Chest and Lung Exam Auscultation: Breath Sounds:-Normal.  Cardiovascular Auscultation:Rythm- Regular. Murmurs & Other Heart Sounds:Auscultation of the heart reveals- No Murmurs.  Abdomen Inspection:-Inspeection Normal. Palpation/Percussion:Note:No mass. Palpation and Percussion of the abdomen reveal- Non Tender, Non Distended + BS, no rebound or guarding.   Neurologic Cranial Nerve exam:- CN III-XII intact(No nystagmus), symmetric smile. Drift Test:- No drift. Romberg Exam:- Negative.  Finger to Nose:- Normal/Intact Strength:- 5/5 equal and symmetric strength both upper and lower extremities.   Left lower rib area pain on palpation. From anterior lower rib to back area. Somewhat dermatomal pattern      Assessment & Plan:   Patient Instructions  1. Hypertension, unspecified type Amlodipine 10 mg tab daily, chlorthalidone 25 mg daily, toprol xl 50 mg daily. Bp still high today and was higher at [facility]. Add on hydralazine 25 mg three times daily. Check bp daily and update me on Thursday morning on bp levels.  2. Rib pain UC rx prednisone. Pain seems to have improved after starting prednisone. Continue. If any rash or blister outbreak in rib area let me know.  3. Bronchitis -UC rx'd azithromycin. Can continue antibiotic. Cxr reviewed today. Saw impression in care everywhere.  4. Elevated blood sugar Will get A1c and cmp on follow up in 10 days. Continue metformin  5. Hyperlipidemia, unspecified hyperlipidemia type Continue  atorvastatin. Check lipid panel on follow up.  Follow up 10 days or sooner if needed.   Time spent with patient today was 40  minutes which consisted of chart rediew, discussing diagnosis, work [employer]  and documentation.

## 2023-03-24 ENCOUNTER — Encounter: Payer: Self-pay | Admitting: Medical

## 2023-03-30 ENCOUNTER — Other Ambulatory Visit (INDEPENDENT_AMBULATORY_CARE_PROVIDER_SITE_OTHER): Payer: BC Managed Care – PPO

## 2023-03-30 DIAGNOSIS — R739 Hyperglycemia, unspecified: Secondary | ICD-10-CM

## 2023-03-30 DIAGNOSIS — Z125 Encounter for screening for malignant neoplasm of prostate: Secondary | ICD-10-CM

## 2023-03-30 DIAGNOSIS — I10 Essential (primary) hypertension: Secondary | ICD-10-CM

## 2023-03-30 DIAGNOSIS — E785 Hyperlipidemia, unspecified: Secondary | ICD-10-CM

## 2023-03-30 LAB — COMPREHENSIVE METABOLIC PANEL
ALT: 16 U/L (ref 0–53)
AST: 14 U/L (ref 0–37)
Albumin: 4 g/dL (ref 3.5–5.2)
Alkaline Phosphatase: 82 U/L (ref 39–117)
BUN: 20 mg/dL (ref 6–23)
CO2: 31 meq/L (ref 19–32)
Calcium: 9 mg/dL (ref 8.4–10.5)
Chloride: 99 meq/L (ref 96–112)
Creatinine, Ser: 1.22 mg/dL (ref 0.40–1.50)
GFR: 73.69 mL/min (ref >60.00)
Glucose, Bld: 103 mg/dL — ABNORMAL HIGH (ref 70–99)
Potassium: 3.5 meq/L (ref 3.5–5.1)
Sodium: 138 meq/L (ref 135–145)
Total Bilirubin: 1 mg/dL (ref 0.2–1.2)
Total Protein: 7.1 g/dL (ref 6.0–8.3)

## 2023-03-30 LAB — LIPID PANEL
Cholesterol: 155 mg/dL (ref 0–200)
HDL: 36.9 mg/dL — ABNORMAL LOW (ref >39.00)
LDL Cholesterol: 103 mg/dL — ABNORMAL HIGH (ref 0–99)
NonHDL: 117.78
Total CHOL/HDL Ratio: 4
Triglycerides: 76 mg/dL (ref 0.0–149.0)
VLDL: 15.2 mg/dL (ref 0.0–40.0)

## 2023-03-30 LAB — HEMOGLOBIN A1C: Hgb A1c MFr Bld: 6.4 % (ref 4.6–6.5)

## 2023-03-30 LAB — PSA: PSA: 1.52 ng/mL (ref 0.10–4.00)

## 2023-04-01 ENCOUNTER — Ambulatory Visit: Payer: BC Managed Care – PPO | Admitting: Medical

## 2023-04-01 VITALS — BP 138/88 | Temp 78.0°F | Resp 18 | Ht 72.0 in | Wt 371.0 lb

## 2023-04-01 DIAGNOSIS — R739 Hyperglycemia, unspecified: Secondary | ICD-10-CM | POA: Diagnosis not present

## 2023-04-01 DIAGNOSIS — I10 Essential (primary) hypertension: Secondary | ICD-10-CM | POA: Diagnosis not present

## 2023-04-01 MED ORDER — HYDRALAZINE HCL 50 MG PO TABS: 50.0000 mg | ORAL_TABLET | Freq: Three times a day (TID) | ORAL | 3 refills | Status: DC

## 2023-04-01 NOTE — Patient Instructions (Addendum)
Hypertension. 138/88. with your home bp cuff 157/114. I think smaller cuff with your machine effected readings. Checked 3 times and I got 138/88. Want bp to be closer to 130/80. Blood pressure readings fluctuating, currently on amlodipine, chlorthalidone, hydralazine, and metoprolol. Discussed the importance of consistent medication use and the potential benefits of weight loss and low salt diet on blood pressure control. -Increase hydralazine to 50mg  three times a day. -Continue amlodipine, chlorthalidone, and metoprolol as currently prescribed. -Encourage low salt diet and regular exercise. -Check blood pressure at home and send updates via MyChart in 7-10 days.  Elevated blood sugar.(Prediabetic range) Currently on metformin, no specific issues discussed. -Continue metformin as currently prescribed.  Follow-up Based on MyChart blood pressure updates, will determine appropriate time for next in-office visit.

## 2023-04-01 NOTE — Progress Notes (Signed)
Subjective:    Patient ID: Wesley Obrien, male    DOB: 1980/11/16, 42 y.o.   MRN: [id number]  HPI  Here for follow up.  Discussed the use of AI scribe software for clinical note transcription with the patient, who gave verbal consent to proceed.  History of Present Illness   The patient, on a regimen of amlodipine, chlorthalidone, hydralazine and metoprolol. Pt  has been experiencing issues with blood pressure regulation. He had been off his medication for approximately thirty days prior to his last visit but has since resumed his full regimen.  The patient has been diligent in taking his medication, aided by the use of a pill case to organize his daily doses. However, he has noticed that his blood pressure readings tend to fluctuate, often presenting higher initially before lowering on subsequent checks. This pattern has been observed both in the office and at home, where the patient has been monitoring his blood pressure.  Despite these fluctuations, the patient has been generally adherent to his medication regimen, with the exception of occasionally forgetting to take a dose. He has been advised to take his medication at the same time each day to maintain consistency.  In addition to his medication, the patient has been advised to follow a low-salt diet and engage in regular physical activity, such as walking, to help manage his blood pressure. He is currently awaiting the arrival of a larger blood pressure cuff to aid in his home monitoring.   No cardiac or neurologic signs/symptoms       Review of Systems  Constitutional:  Negative for chills, fatigue and fever.  Respiratory:  Negative for cough, chest tightness and wheezing.   Cardiovascular:  Negative for chest pain and palpitations.  Gastrointestinal:  Negative for abdominal pain.  Genitourinary:  Negative for dysuria and frequency.  Musculoskeletal:        Rib area pain left side.  Skin:  Negative for rash.  Neurological:   Negative for dizziness, weakness and headaches.  Hematological:  Negative for adenopathy. Does not bruise/bleed easily.  Psychiatric/Behavioral:  Negative for behavioral problems and decreased concentration.    Past Medical History:  Diagnosis Date   Angio-edema    History of chicken pox    Hypertension    Urticaria      Social History   Socioeconomic History   Marital status: Single    Spouse name: Not on file   Number of children: Not on file   Years of education: Not on file   Highest education level: GED or equivalent  Occupational History   Not on file  Tobacco Use   Smoking status: Former    Types: Cigars    Quit date: 04/16/2016    Years since quitting: 6.9    Passive exposure: Past   Smokeless tobacco: Never  Vaping Use   Vaping status: Some Days  Substance and Sexual Activity   Alcohol use: Yes    Comment: 6 pack 2-3 times a month.   Drug use: No   Sexual activity: Yes    Birth control/protection: None  Other Topics Concern   Not on file  Social History Narrative   Not on file   Social Drivers of Health   Financial Resource Strain: Medium Risk (04/01/2023)   Overall Financial Resource Strain (CARDIA)    Difficulty of Paying Living Expenses: Somewhat hard  Food Insecurity: No Food Insecurity (04/01/2023)   Hunger Vital Sign    Worried About Running Out of Food in  the Last Year: Never true    Ran Out of Food in the Last Year: Never true  Transportation Needs: No Transportation Needs (04/01/2023)   PRAPARE - Administrator, Civil Service (Medical): No    Lack of Transportation (Non-Medical): No  Physical Activity: Unknown (04/01/2023)   Exercise Vital Sign    Days of Exercise per Week: 0 days    Minutes of Exercise per Session: Not on file  Stress: Stress Concern Present (04/01/2023)   [employer] of Occupational Health - Occupational Stress Questionnaire    Feeling of Stress : To some extent  Social Connections: Moderately Isolated  (04/01/2023)   Social Connection and Isolation Panel [NHANES]    Frequency of Communication with Friends and Family: More than three times a week    Frequency of Social Gatherings with Friends and Family: Once a week    Attends Religious Services: Never    Database administrator or Organizations: No    Attends Engineer, structural: Not on file    Marital Status: Living with partner  Intimate Partner Violence: Not on file    Past Surgical History:  Procedure Laterality Date   NO PAST SURGERIES      Family History  Problem Relation Age of Onset   Allergic rhinitis Mother    Healthy Mother    Diabetes Father    Hypertension Father    Healthy Sister    Allergic rhinitis Son    Healthy Son     Allergies  Allergen Reactions   Clonidine Derivatives Swelling    Facial swelling.    Current Outpatient Medications on File Prior to Visit  Medication Sig Dispense Refill   amLODipine (NORVASC) 10 MG tablet Take 1 tablet (10 mg total) by mouth daily. 30 tablet 0   atorvastatin (LIPITOR) 10 MG tablet Take 1 tablet (10 mg total) by mouth daily. 90 tablet 3   chlorthalidone (HYGROTON) 25 MG tablet Take 1 tablet (25 mg total) by mouth daily. 30 tablet 0   famotidine (PEPCID) 20 MG tablet Take 1 tablet (20 mg total) by mouth 2 (two) times daily. (Patient not taking: Reported on 06/23/2022) 30 tablet 5   levocetirizine (XYZAL) 5 MG tablet Take 1 tablet (5 mg total) by mouth 2 (two) times daily. (Patient not taking: Reported on 03/22/2023) 60 tablet 5   metFORMIN (GLUCOPHAGE) 500 MG tablet TAKE 1 TABLET BY MOUTH DAILY  WITH BREAKFAST 90 tablet 3   metoprolol succinate (TOPROL-XL) 50 MG 24 hr tablet TAKE 1 TABLET BY MOUTH DAILY  WITH OR IMMEDIATELY FOLLOWING A  MEAL 30 tablet 0   Olopatadine-Mometasone (RYALTRIS) 665-25 MCG/ACT SUSP Place 2 sprays into the nose in the morning and at bedtime. (Patient taking differently: Place 2 sprays into the nose in the morning and at bedtime. As needed)  29 g 5   Semaglutide-Weight Management 0.25 MG/0.5ML SOAJ 0.25 mg injection weekly. (Patient not taking: Reported on 06/23/2022) 2 mL 0   No current facility-administered medications on file prior to visit.    BP 138/88 (Cuff Size: Large) Comment: rechecked twice  Temp (!) 78 F (25.6 C)   Resp 18   Ht 6' (1.829 m)   Wt (!) 371 lb (168.3 kg)   SpO2 96%   BMI 50.32 kg/m        Objective:   Physical Exam Bp 138/88  General Mental Status- Alert. General Appearance- Not in acute distress.   Skin General: Color- Normal Color. Moisture- Normal Moisture.  Neck Carotid Arteries- Normal color. Moisture- Normal Moisture. No carotid bruits. No JVD.  Chest and Lung Exam Auscultation: Breath Sounds:-Normal.  Cardiovascular Auscultation:Rythm- Regular. Murmurs & Other Heart Sounds:Auscultation of the heart reveals- No Murmurs.  Abdomen Inspection:-Inspeection Normal. Palpation/Percussion:Note:No mass. Palpation and Percussion of the abdomen reveal- Non Tender, Non Distended + BS, no rebound or guarding.    Neurologic Cranial Nerve exam:- CN III-XII intact(No nystagmus), symmetric smile. Strength:- 5/5 equal and symmetric strength both upper and lower extremities.       Assessment & Plan:   Assessment and Plan    Patient Instructions  Hypertension. 138/88. with your home bp cuff 157/114. I think smaller cuff with your machine effected readings. Checked 3 times and I got 138/88. Want bp to be closer to 130/80. Blood pressure readings fluctuating, currently on amlodipine, chlorthalidone, hydralazine, and metoprolol. Discussed the importance of consistent medication use and the potential benefits of weight loss and low salt diet on blood pressure control. -Increase hydralazine to 50mg  three times a day. -Continue amlodipine, chlorthalidone, and metoprolol as currently prescribed. -Encourage low salt diet and regular exercise. -Check blood pressure at home and send updates via  MyChart in 7-10 days.  Elevated blood sugar.(Prediabetic range) Currently on metformin, no specific issues discussed. -Continue metformin as currently prescribed.  Follow-up Based on MyChart blood pressure updates, will determine appropriate time for next in-office visit.   Esperanza Richters, PA-C

## 2023-04-19 ENCOUNTER — Other Ambulatory Visit: Payer: Self-pay | Admitting: Medical

## 2023-05-09 ENCOUNTER — Encounter: Payer: Self-pay | Admitting: Medical

## 2023-05-10 MED ORDER — HYDRALAZINE HCL 50 MG PO TABS: 50.0000 mg | ORAL_TABLET | Freq: Three times a day (TID) | ORAL | 3 refills | Status: DC

## 2023-05-26 ENCOUNTER — Other Ambulatory Visit: Payer: Self-pay | Admitting: Emergency Medicine

## 2023-05-26 MED ORDER — CHLORTHALIDONE 25 MG PO TABS: 25.0000 mg | ORAL_TABLET | Freq: Every day | ORAL | 0 refills | Status: DC

## 2023-05-26 NOTE — Telephone Encounter (Signed)
 Copied from CRM [id number]. Topic: Clinical - Medication Refill >> May 26, 2023 11:30 AM Joanell NOVAK wrote: Most Recent Primary Care Visit:  Provider: DORINA LOVING  Department: [facility]  Visit Type: OFFICE VISIT  Date: 04/01/2023  Medication: chlorthalidone  (HYGROTON ) 25 MG tablet   Has the patient contacted their pharmacy? Yes (Agent: If no, request that the patient contact the pharmacy for the refill. If patient does not wish to contact the pharmacy document the reason why and proceed with request.) (Agent: If yes, when and what did the pharmacy advise?) Pharmacy stated that he doesn't have a active prescription on file   Is this the correct pharmacy for this prescription? Yes If no, delete pharmacy and type the correct one.  This is the patient's preferred pharmacy:  [id number] [place], [place] - [address] [place] [address] Phone: [phone] Fax: [phone]  Has the prescription been filled recently? No  Is the patient out of the medication? Yes  Has the patient been seen for an appointment in the last year OR does the patient have an upcoming appointment? Yes  Can we respond through MyChart? Yes  Agent: Please be advised that Rx refills may take up to 3 business days. We ask that you follow-up with your pharmacy.

## 2023-05-26 NOTE — Telephone Encounter (Signed)
 Last Fill: 06/01/22  Last OV: 04/01/23 Next OV: None Scheduled  Routing to provider for review/authorization.

## 2023-08-22 ENCOUNTER — Other Ambulatory Visit: Payer: Self-pay | Admitting: Medical

## 2023-08-26 ENCOUNTER — Other Ambulatory Visit: Payer: Self-pay | Admitting: Medical

## 2023-08-26 MED ORDER — METOPROLOL SUCCINATE ER 50 MG PO TB24: ORAL_TABLET | ORAL | 0 refills | Status: DC

## 2023-08-26 MED ORDER — AMLODIPINE BESYLATE 10 MG PO TABS: 10.0000 mg | ORAL_TABLET | Freq: Every day | ORAL | 0 refills | Status: DC

## 2023-10-07 ENCOUNTER — Encounter: Payer: Self-pay | Admitting: Medical

## 2023-10-14 ENCOUNTER — Telehealth: Payer: Self-pay | Admitting: Medical

## 2023-10-14 MED ORDER — METOPROLOL SUCCINATE ER 50 MG PO TB24: ORAL_TABLET | ORAL | 0 refills | Status: DC

## 2023-10-14 MED ORDER — METFORMIN HCL 500 MG PO TABS: 500.0000 mg | ORAL_TABLET | Freq: Every day | ORAL | 3 refills | Status: DC

## 2023-10-14 NOTE — Telephone Encounter (Signed)
 Rx sent.

## 2023-10-14 NOTE — Addendum Note (Signed)
 Addended by: DORLENE CHIQUITA RAMAN on: 10/14/2023 10:56 AM   Modules accepted: Orders

## 2023-10-14 NOTE — Telephone Encounter (Signed)
 Pt's wife is requesting a refill and pharmacy change.   metFORMIN  (GLUCOPHAGE ) 500 MG tablet  metoprolol  succinate (TOPROL -XL) 50 MG 24 hr tablet  [facility] [address], [place], [place] [address]

## 2024-01-05 ENCOUNTER — Other Ambulatory Visit: Payer: Self-pay | Admitting: Medical

## 2024-01-05 ENCOUNTER — Telehealth: Payer: Self-pay

## 2024-01-05 MED ORDER — AMLODIPINE BESYLATE 10 MG PO TABS: 10.0000 mg | ORAL_TABLET | Freq: Every day | ORAL | 1 refills | Status: DC

## 2024-01-05 MED ORDER — METOPROLOL SUCCINATE ER 50 MG PO TB24: ORAL_TABLET | ORAL | 1 refills | Status: DC

## 2024-01-05 NOTE — Telephone Encounter (Unsigned)
 Copied from CRM [id number]. Topic: Clinical - Medication Refill >> Jan 05, 2024 11:46 AM [place] E wrote: Medication: metoprolol  succinate (TOPROL -XL) 50 MG 24 hr tablet  Has the patient contacted their pharmacy? Yes (Agent: If no, request that the patient contact the pharmacy for the refill. If patient does not wish to contact the pharmacy document the reason why and proceed with request.) (Agent: If yes, when and what did the pharmacy advise?)  This is the patient's preferred pharmacy:  [id number] [place], [place] - [address] [place] [address] Phone: [phone] Fax: [phone]   Is this the correct pharmacy for this prescription? Yes If no, delete pharmacy and type the correct one.   Has the prescription been filled recently? No  Is the patient out of the medication? Yes  Has the patient been seen for an appointment in the last year OR does the patient have an upcoming appointment? Yes  Can we respond through MyChart? Yes  Agent: Please be advised that Rx refills may take up to 3 business days. We ask that you follow-up with your pharmacy.

## 2024-01-05 NOTE — Telephone Encounter (Signed)
 Copied from CRM [id number]. Topic: Clinical - Medication Question >> Jan 05, 2024 11:47 AM [place] E wrote: Reason for CRM: Patient's wife, Mick, called in questioning if patient should be on metoprolol  succinate (TOPROL -XL) 50 MG 24 hr tablet and chlorthalidone  (HYGROTON ) 25 MG tablet as both of those medications are beta-blockers. Callback number for wife is [phone].

## 2024-01-06 NOTE — Telephone Encounter (Signed)
 Spoke to pt he was understanding  Advised him that edward wants to see him within the next month says he will call back to schedule due to him being at work

## 2024-01-11 ENCOUNTER — Telehealth: Payer: Self-pay

## 2024-01-11 ENCOUNTER — Telehealth: Admitting: Medical

## 2024-01-11 DIAGNOSIS — J069 Acute upper respiratory infection, unspecified: Secondary | ICD-10-CM | POA: Diagnosis not present

## 2024-01-11 MED ORDER — FLUTICASONE PROPIONATE 50 MCG/ACT NA SUSP: 2.0000 | Freq: Every day | NASAL | 1 refills | Status: DC

## 2024-01-11 MED ORDER — AZITHROMYCIN 250 MG PO TABS: ORAL_TABLET | ORAL | 0 refills | Status: AC

## 2024-01-11 MED ORDER — BENZONATATE 100 MG PO CAPS: 100.0000 mg | ORAL_CAPSULE | Freq: Three times a day (TID) | ORAL | 0 refills | Status: DC | PRN

## 2024-01-11 NOTE — Progress Notes (Signed)
 Virtual Visit via Video Note  I connected with Wesley Obrien on 01/11/24 at  3:20 PM EDT by a video enabled telemedicine application and verified that I am speaking with the correct person using two identifiers.  Location: Patient: In car. Lane Provider: [place] in office.   I discussed the limitations of evaluation and management by telemedicine and the availability of in person appointments. The patient expressed understanding and agreed to proceed.  History of Present Illness: Discussed the use of AI scribe software for clinical note transcription with the patient, who gave verbal consent to proceed.  History of Present Illness         Wesley Obrien is a 43 year old male who presents with a seven-day history of productive cough.  He has been experiencing a productive cough for the past seven days, initially attributing it to a change in seasons. He has been taking vitamin C. No wheezing, fever, nausea, vomiting, diarrhea, body aches, chills, or sweats. He notes soreness in the stomach area.  He mentions slight nasal congestion but describes the cough as not originating from the nasal area. His wife and children have been diagnosed with bronchitis, and he believes his wife received antibiotics for her condition.    Observations/Objective: General-no acute distress, pleasant, oriented. Lungs- on inspection lungs appear unlabored. Neck- no tracheal deviation or jvd on inspection. Neuro- gross motor function appears intact.  Assessment and Plan: Assessment and Plan           Patient Instructions  Acute upper respiratory infection Acute upper respiratory infection with productive cough and mild nasal congestion. No severe bacterial infection signs. - Prescribe nasal spray for congestion. - Prescribe benzonatate  for cough. - Prescribe azithromycin  if symptoms worsen or persist by weekend. (wife and children dx with bronchitis and on antibiotics) virtual visit so explained to pt  difficult to say does not need antibiotic though so far would advise not to use antibiotic unless symptoms worsen as discussed.    Follow Up Instructions:    I discussed the assessment and treatment plan with the patient. The patient was provided an opportunity to ask questions and all were answered. The patient agreed with the plan and demonstrated an understanding of the instructions.   The patient was advised to call back or seek an in-person evaluation if the symptoms worsen or if the condition fails to improve as anticipated.   Wesley Lederman, PA-C

## 2024-01-11 NOTE — Telephone Encounter (Signed)
 Apt today 3:20

## 2024-01-11 NOTE — Patient Instructions (Signed)
 Acute upper respiratory infection Acute upper respiratory infection with productive cough and mild nasal congestion. No severe bacterial infection signs. - Prescribe nasal spray for congestion. - Prescribe benzonatate  for cough. - Prescribe azithromycin  if symptoms worsen or persist by weekend. (wife and children dx with bronchitis and on antibiotics) virtual visit so explained to pt difficult to say does not need antibiotic though so far would advise not to use antibiotic unless symptoms worsen as discussed.

## 2024-01-11 NOTE — Telephone Encounter (Signed)
 Called to get pt situated for vv but received no answer  Voicemail left  Will call again later

## 2024-01-11 NOTE — Telephone Encounter (Signed)
-----   Message from Jamee JONELLE Antonio Cyndee sent at 01/11/2024  1:13 PM EDT ----- I saw pt wife today----  she is sick with bronchitis / sinusitis and both kids are too-- they are requesting a virtual with edward or someone if possible

## 2024-01-11 NOTE — Telephone Encounter (Signed)
 Spoke to pt says he is not feeling well  Wanted vv with edward apt scheduled for today at 3:20

## 2024-03-27 ENCOUNTER — Encounter: Admitting: Medical

## 2024-04-11 ENCOUNTER — Encounter: Payer: Self-pay | Admitting: Medical

## 2024-04-11 ENCOUNTER — Ambulatory Visit (INDEPENDENT_AMBULATORY_CARE_PROVIDER_SITE_OTHER): Admitting: Medical

## 2024-04-11 ENCOUNTER — Other Ambulatory Visit: Payer: Self-pay | Admitting: Medical

## 2024-04-11 VITALS — BP 140/100 | HR 88 | Temp 98.1°F | Resp 18 | Ht 72.0 in | Wt 360.6 lb

## 2024-04-11 DIAGNOSIS — I10 Essential (primary) hypertension: Secondary | ICD-10-CM | POA: Diagnosis not present

## 2024-04-11 DIAGNOSIS — R739 Hyperglycemia, unspecified: Secondary | ICD-10-CM

## 2024-04-11 DIAGNOSIS — Z125 Encounter for screening for malignant neoplasm of prostate: Secondary | ICD-10-CM | POA: Diagnosis not present

## 2024-04-11 DIAGNOSIS — E785 Hyperlipidemia, unspecified: Secondary | ICD-10-CM | POA: Diagnosis not present

## 2024-04-11 DIAGNOSIS — R0683 Snoring: Secondary | ICD-10-CM

## 2024-04-11 DIAGNOSIS — R350 Frequency of micturition: Secondary | ICD-10-CM

## 2024-04-11 DIAGNOSIS — Z0000 Encounter for general adult medical examination without abnormal findings: Secondary | ICD-10-CM

## 2024-04-11 MED ORDER — HYDRALAZINE HCL 100 MG PO TABS: 100.0000 mg | ORAL_TABLET | Freq: Three times a day (TID) | ORAL | 0 refills | Status: AC

## 2024-04-11 NOTE — Patient Instructions (Addendum)
 Adult Wellness Visit Routine wellness exam conducted. Discussed lifestyle factors including diet, exercise, and alcohol consumption. Declined flu and tetanus vaccines today.  -cbc, cmp, lipid, psa and a1c today  Hypertension Blood pressure remains elevated at 140/100 mmHg despite current medication regimen. Reports nocturnal headaches and inconsistent home monitoring. Current medications include hydralazine , which is being increased to improve control. - Increased hydralazine  to 100 mg three times a day. Continue amlodipine  and hydralazine . - Encouraged home blood pressure monitoring and reporting via MyChart.  Benign prostatic hyperplasia with lower urinary tract symptoms possible Reports increased frequency of urination, particularly in the evening. No difficulty initiating urination. Differential includes benign prostatic hyperplasia and elevated blood sugar levels. PSA and HbA1c tests ordered to evaluate prostate health and blood sugar levels. - Ordered PSA test. - Ordered HbA1c test.  Hyperglycemia Frequent urination may be related to elevated blood sugar levels. HbA1c test ordered to assess average blood sugar levels over the past three months. - Ordered HbA1c test. -continue metformin   Fatigue, evaluation for secondary causes including sleep apnea Reports increased fatigue and lethargy, particularly in the evenings. Previous sleep study appointment was not completed. Labs ordered to evaluate potential causes of fatigue. - Reinitiated sleep study appointment. - Ordered B12, thyroid , and B1 level tests on next visit as labs done before I saw you as lab was closing.  follow up in 2 weeks or sooner if needed   Preventive Care 42-28 Years Old, Male Preventive care refers to lifestyle choices and visits with your health care provider that can promote health and wellness. Preventive care visits are also called wellness exams. What can I expect for my preventive care  visit? Counseling During your preventive care visit, your health care provider may ask about your: Medical history, including: Past medical problems. Family medical history. Current health, including: Emotional well-being. Home life and relationship well-being. Sexual activity. Lifestyle, including: Alcohol, nicotine or tobacco, and drug use. Access to firearms. Diet, exercise, and sleep habits. Safety issues such as seatbelt and bike helmet use. Sunscreen use. Work and work astronomer. Physical exam Your health care provider will check your: Height and weight. These may be used to calculate your BMI (body mass index). BMI is a measurement that tells if you are at a healthy weight. Waist circumference. This measures the distance around your waistline. This measurement also tells if you are at a healthy weight and may help predict your risk of certain diseases, such as type 2 diabetes and high blood pressure. Heart rate and blood pressure. Body temperature. Skin for abnormal spots. What immunizations do I need?  Vaccines are usually given at various ages, according to a schedule. Your health care provider will recommend vaccines for you based on your age, medical history, and lifestyle or other factors, such as travel or where you work. What tests do I need? Screening Your health care provider may recommend screening tests for certain conditions. This may include: Lipid and cholesterol levels. Diabetes screening. This is done by checking your blood sugar (glucose) after you have not eaten for a while (fasting). Hepatitis B test. Hepatitis C test. HIV (human immunodeficiency virus) test. STI (sexually transmitted infection) testing, if you are at risk. Lung cancer screening. Prostate cancer screening. Colorectal cancer screening. Talk with your health care provider about your test results, treatment options, and if necessary, the need for more tests. Follow these instructions  at home: Eating and drinking  Eat a diet that includes fresh fruits and vegetables, whole grains, lean protein, and  low-fat dairy products. Take vitamin and mineral supplements as recommended by your health care provider. Do not drink alcohol if your health care provider tells you not to drink. If you drink alcohol: Limit how much you have to 0-2 drinks a day. Know how much alcohol is in your drink. In the [place] one drink equals one 12 oz bottle of beer (355 mL), one 5 oz glass of wine (148 mL), or one 1 oz glass of hard liquor (44 mL). Lifestyle Brush your teeth every morning and night with fluoride toothpaste. Floss one time each day. Exercise for at least 30 minutes 5 or more days each week. Do not use any products that contain nicotine or tobacco. These products include cigarettes, chewing tobacco, and vaping devices, such as e-cigarettes. If you need help quitting, ask your health care provider. Do not use drugs. If you are sexually active, practice safe sex. Use a condom or other form of protection to prevent STIs. Take aspirin only as told by your health care provider. Make sure that you understand how much to take and what form to take. Work with your health care provider to find out whether it is safe and beneficial for you to take aspirin daily. Find healthy ways to manage stress, such as: Meditation, yoga, or listening to music. Journaling. Talking to a trusted person. Spending time with friends and family. Minimize exposure to UV radiation to reduce your risk of skin cancer. Safety Always wear your seat belt while driving or riding in a vehicle. Do not drive: If you have been drinking alcohol. Do not ride with someone who has been drinking. When you are tired or distracted. While texting. If you have been using any mind-altering substances or drugs. Wear a helmet and other protective equipment during sports activities. If you have firearms in your house, make sure you  follow all gun safety procedures. What's next? Go to your health care provider once a year for an annual wellness visit. Ask your health care provider how often you should have your eyes and teeth checked. Stay up to date on all vaccines. This information is not intended to replace advice given to you by your health care provider. Make sure you discuss any questions you have with your health care provider. Document Revised: 10/02/2020 Document Reviewed: 10/02/2020 Elsevier Patient Education  2024 [employer].

## 2024-04-11 NOTE — Progress Notes (Unsigned)
" ° °  Subjective:    Patient ID: Wesley Obrien, male    DOB: 09-04-1980, 43 y.o.   MRN: [id number]  HPI        Review of Systems         Objective:   Physical Exam        Assessment & Plan:    "

## 2024-04-11 NOTE — Progress Notes (Signed)
 "  Subjective:    Patient ID: Wesley Obrien, male    DOB: 08-Apr-1981, 43 y.o.   MRN: [id number]  HPI  Wesley Obrien is a 43 year old male who presents for a wellness exam and evaluation of elevated blood pressure.   He reports a moderately healthy diet with some fried foods and sodas, irregular exercise, alcohol use once or twice a month, and vaping without tobacco smoking. He declined flu and tetanus vaccines today.  Declines vaccines today.  Discussed no colonscopy indicated but will be done at age 34 .  He has had elevated and fluctuating blood pressure readings on prior visits. He is currently off hydralazine  after recently running out of his prescription. He denies symptoms unless checking his blood pressure but did wake with a lingering headache last night.  He has increased urinary frequency, especially nocturia that is about twice his prior baseline, with urgency but no hesitancy. Symptoms improve when he drinks alcohol. He is currently undergoing testing for prostate issues and blood sugar.  He feels lethargic and often falls asleep soon after returning home from work despite nonstrenuous activity. He has not completed a previously scheduled sleep study.  He reports a moderately healthy diet with some fried foods and sodas, irregular exercise, alcohol use once or twice a month, and vaping without tobacco smoking. He declined flu and tetanus vaccines today.         Review of Systems  Constitutional:  Positive for fatigue. Negative for chills and fever.  HENT:  Negative for congestion and drooling.   Respiratory:  Negative for cough, chest tightness, shortness of breath and wheezing.        Snores  Cardiovascular:  Negative for chest pain and palpitations.  Gastrointestinal:  Negative for abdominal pain, blood in stool, constipation and vomiting.  Genitourinary:  Positive for frequency. Negative for dysuria.  Musculoskeletal:  Negative for back pain, joint swelling and neck  stiffness.  Skin:  Negative for rash.  Neurological:  Negative for dizziness, syncope, numbness and headaches.  Hematological:  Negative for adenopathy. Does not bruise/bleed easily.  Psychiatric/Behavioral:  Negative for behavioral problems and decreased concentration. The patient is not nervous/anxious.      Past Medical History:  Diagnosis Date   Angio-edema    History of chicken pox    Hypertension    Urticaria      Social History   Socioeconomic History   Marital status: Single    Spouse name: Not on file   Number of children: Not on file   Years of education: Not on file   Highest education level: GED or equivalent  Occupational History   Not on file  Tobacco Use   Smoking status: Former    Types: Cigars    Quit date: 04/16/2016    Years since quitting: 7.9    Passive exposure: Past   Smokeless tobacco: Never  Vaping Use   Vaping status: Some Days  Substance and Sexual Activity   Alcohol use: Yes    Comment: 6 pack 2-3 times a month.   Drug use: No   Sexual activity: Yes    Birth control/protection: None  Other Topics Concern   Not on file  Social History Narrative   Not on file   Social Drivers of Health   Tobacco Use: Medium Risk (04/11/2024)   Patient History    Smoking Tobacco Use: Former    Smokeless Tobacco Use: Never    Passive Exposure: Past  Physicist, Medical Strain: Medium  Risk (04/01/2023)   Overall Financial Resource Strain (CARDIA)    Difficulty of Paying Living Expenses: Somewhat hard  Food Insecurity: No Food Insecurity (04/01/2023)   Hunger Vital Sign    Worried About Running Out of Food in the Last Year: Never true    Ran Out of Food in the Last Year: Never true  Transportation Needs: No Transportation Needs (04/01/2023)   PRAPARE - Administrator, Civil Service (Medical): No    Lack of Transportation (Non-Medical): No  Physical Activity: Unknown (04/01/2023)   Exercise Vital Sign    Days of Exercise per Week: 0 days     Minutes of Exercise per Session: Not on file  Stress: Stress Concern Present (04/01/2023)   [employer] of Occupational Health - Occupational Stress Questionnaire    Feeling of Stress : To some extent  Social Connections: Moderately Isolated (04/01/2023)   Social Connection and Isolation Panel    Frequency of Communication with Friends and Family: More than three times a week    Frequency of Social Gatherings with Friends and Family: Once a week    Attends Religious Services: Never    Database Administrator or Organizations: No    Attends Engineer, Structural: Not on file    Marital Status: Living with partner  Intimate Partner Violence: Not on file  Depression (PHQ2-9): Medium Risk (04/11/2024)   Depression (PHQ2-9)    PHQ-2 Score: 8  Alcohol Screen: Low Risk (04/01/2023)   Alcohol Screen    Last Alcohol Screening Score (AUDIT): 0  Housing: High Risk (04/01/2023)   Housing Stability Vital Sign    Unable to Pay for Housing in the Last Year: Yes    Number of Times Moved in the Last Year: 0    Homeless in the Last Year: No  Utilities: Not on file  Health Literacy: Not on file    Past Surgical History:  Procedure Laterality Date   NO PAST SURGERIES      Family History  Problem Relation Age of Onset   Allergic rhinitis Mother    Healthy Mother    Diabetes Father    Hypertension Father    Healthy Sister    Allergic rhinitis Son    Healthy Son     Allergies[1]  Medications Ordered Prior to Encounter[2]  BP (!) 140/100 (BP Location: Left Arm, Patient Position: Sitting, Cuff Size: Large)   Pulse 88   Temp 98.1 F (36.7 C) (Oral)   Resp 18   Ht 6' (1.829 m)   Wt (!) 360 lb 9.6 oz (163.6 kg)   SpO2 96%   BMI 48.91 kg/m         Objective:   Physical Exam   General Mental Status- Alert. General Appearance- Not in acute distress.   Skin General: Color- Normal Color. Moisture- Normal Moisture.  Neck Carotid Arteries- Normal color.  Moisture- Normal Moisture. No carotid bruits. No JVD.  Chest and Lung Exam Auscultation: Breath Sounds:-Normal.  Cardiovascular Auscultation:Rythm- Regular. Murmurs & Other Heart Sounds:Auscultation of the heart reveals- No Murmurs.  Abdomen Inspection:-Inspeection Normal. Palpation/Percussion:Note:No mass. Palpation and Percussion of the abdomen reveal- Non Tender, Non Distended + BS, no rebound or guarding.    Neurologic Cranial Nerve exam:- CN III-XII intact(No nystagmus), symmetric smile. Strength:- 5/5 equal and symmetric strength both upper and lower extremities.      Assessment & Plan:   Adult Wellness Visit Routine wellness exam conducted. Discussed lifestyle factors including diet, exercise, and alcohol consumption. Declined  flu and tetanus vaccines today. -cbc, cmp, lipid, psa and a1c today  Hypertension Blood pressure remains elevated at 140/100 mmHg despite current medication regimen. Reports nocturnal headaches and inconsistent home monitoring. Current medications include hydralazine , which is being increased to improve control. - Increased hydralazine  to 100 mg three times a day. Continue amlodipine  and hydralazine . - Encouraged home blood pressure monitoring and reporting via MyChart.  Benign prostatic hyperplasia with lower urinary tract symptoms possible Reports increased frequency of urination, particularly in the evening. No difficulty initiating urination. Differential includes benign prostatic hyperplasia and elevated blood sugar levels. PSA and HbA1c tests ordered to evaluate prostate health and blood sugar levels. - Ordered PSA test. - Ordered HbA1c test.  Hyperglycemia Frequent urination may be related to elevated blood sugar levels. HbA1c test ordered to assess average blood sugar levels over the past three months. - Ordered HbA1c test. -continue metformin   Fatigue, evaluation for secondary causes including sleep apnea Reports increased fatigue and  lethargy, particularly in the evenings. Previous sleep study appointment was not completed. Labs ordered to evaluate potential causes of fatigue. - Reinitiated sleep study appointment. - Ordered B12, thyroid , and B1 level tests on next visit as labs done before I saw you as lab was closing.  follow up in 2 weeks or sooner if needed  Dallas Maxwell, PA-C   [id number] charge as did address htn, bph type urinary complaints and snoring.  Kaile Bixler, PA-C     [1]  Allergies Allergen Reactions   Clonidine And Derivatives Swelling    Facial swelling.  [2]  Current Outpatient Medications on File Prior to Visit  Medication Sig Dispense Refill   amLODipine  (NORVASC ) 10 MG tablet Take 1 tablet (10 mg total) by mouth daily. 30 tablet 1   atorvastatin  (LIPITOR) 10 MG tablet Take 1 tablet (10 mg total) by mouth daily. 90 tablet 3   chlorthalidone  (HYGROTON ) 25 MG tablet TAKE 1 TABLET (25 MG TOTAL) BY MOUTH DAILY. 90 tablet 0   metFORMIN  (GLUCOPHAGE ) 500 MG tablet Take 1 tablet (500 mg total) by mouth daily with breakfast. 90 tablet 3   metoprolol  succinate (TOPROL -XL) 50 MG 24 hr tablet TAKE 1 TABLET BY MOUTH DAILY  WITH OR IMMEDIATELY FOLLOWING A  MEAL 30 tablet 1   Olopatadine -Mometasone (RYALTRIS ) 665-25 MCG/ACT SUSP Place 2 sprays into the nose in the morning and at bedtime. (Patient taking differently: Place 2 sprays into the nose in the morning and at bedtime. As needed) 29 g 5   No current facility-administered medications on file prior to visit.   "

## 2024-04-12 ENCOUNTER — Ambulatory Visit: Payer: Self-pay | Admitting: Medical

## 2024-04-12 ENCOUNTER — Ambulatory Visit

## 2024-04-12 DIAGNOSIS — Z0000 Encounter for general adult medical examination without abnormal findings: Secondary | ICD-10-CM

## 2024-04-12 LAB — CBC WITH DIFFERENTIAL/PLATELET
Basophils Absolute: 0.1 K/uL (ref 0.0–0.1)
Basophils Relative: 1.1 % (ref 0.0–3.0)
Eosinophils Absolute: 0.1 K/uL (ref 0.0–0.7)
Eosinophils Relative: 1.7 % (ref 0.0–5.0)
HCT: 46.1 % (ref 39.0–52.0)
Hemoglobin: 14.8 g/dL (ref 13.0–17.0)
Lymphocytes Relative: 22.5 % (ref 12.0–46.0)
Lymphs Abs: 1.8 K/uL (ref 0.7–4.0)
MCHC: 32.2 g/dL (ref 30.0–36.0)
MCV: 86.5 fl (ref 78.0–100.0)
Monocytes Absolute: 0.6 K/uL (ref 0.1–1.0)
Monocytes Relative: 7.5 % (ref 3.0–12.0)
Neutro Abs: 5.4 K/uL (ref 1.4–7.7)
Neutrophils Relative %: 67.2 % (ref 43.0–77.0)
Platelets: 210 K/uL (ref 150.0–400.0)
RBC: 5.33 Mil/uL (ref 4.22–5.81)
RDW: 13.8 % (ref 11.5–15.5)
WBC: 8.1 K/uL (ref 4.0–10.5)

## 2024-04-12 LAB — COMPREHENSIVE METABOLIC PANEL WITH GFR
ALT: 18 U/L (ref 3–53)
AST: 15 U/L (ref 5–37)
Albumin: 4.1 g/dL (ref 3.5–5.2)
Alkaline Phosphatase: 66 U/L (ref 39–117)
BUN: 13 mg/dL (ref 6–23)
CO2: 26 meq/L (ref 19–32)
Calcium: 9.3 mg/dL (ref 8.4–10.5)
Chloride: 103 meq/L (ref 96–112)
Creatinine, Ser: 1.2 mg/dL (ref 0.40–1.50)
GFR: 74.62 mL/min
Glucose, Bld: 94 mg/dL (ref 70–99)
Potassium: 3.3 meq/L — ABNORMAL LOW (ref 3.5–5.1)
Sodium: 138 meq/L (ref 135–145)
Total Bilirubin: 0.8 mg/dL (ref 0.2–1.2)
Total Protein: 7.3 g/dL (ref 6.0–8.3)

## 2024-04-12 LAB — LIPID PANEL
Cholesterol: 187 mg/dL
HDL: 43 mg/dL
LDL Cholesterol (Calc): 126 mg/dL — ABNORMAL HIGH
Non-HDL Cholesterol (Calc): 144 mg/dL — ABNORMAL HIGH
Total CHOL/HDL Ratio: 4.3 (calc)
Triglycerides: 82 mg/dL

## 2024-04-12 LAB — PSA: PSA: 1.38 ng/mL (ref 0.10–4.00)

## 2024-04-12 LAB — HEMOGLOBIN A1C: Hgb A1c MFr Bld: 6 % (ref 4.6–6.5)

## 2024-04-14 MED ORDER — POTASSIUM CHLORIDE CRYS ER 10 MEQ PO TBCR: 10.0000 meq | EXTENDED_RELEASE_TABLET | Freq: Every day | ORAL | 0 refills | Status: AC

## 2024-04-24 ENCOUNTER — Encounter: Payer: Self-pay | Admitting: *Deleted

## 2024-04-24 ENCOUNTER — Other Ambulatory Visit: Payer: Self-pay

## 2024-04-24 NOTE — Addendum Note (Signed)
 Addended by: DORINA DALLAS DORINA PA-C M on: 04/24/2024 08:58 PM   Modules accepted: Orders

## 2024-04-28 ENCOUNTER — Ambulatory Visit: Admitting: Medical

## 2024-04-28 VITALS — BP 130/98 | HR 78 | Temp 97.9°F | Resp 15 | Ht 72.0 in | Wt 366.4 lb

## 2024-04-28 DIAGNOSIS — R739 Hyperglycemia, unspecified: Secondary | ICD-10-CM | POA: Diagnosis not present

## 2024-04-28 DIAGNOSIS — I10 Essential (primary) hypertension: Secondary | ICD-10-CM | POA: Diagnosis not present

## 2024-04-28 MED ORDER — METOPROLOL SUCCINATE ER 50 MG PO TB24: 50.0000 mg | ORAL_TABLET | Freq: Every day | ORAL | 3 refills | Status: AC

## 2024-04-28 NOTE — Progress Notes (Signed)
 "  Subjective:    Patient ID: Wesley Obrien, male    DOB: 10-Aug-1980, 44 y.o.   MRM: [id number]  HPI Last visit AVS for htn below in .    Hypertension Blood pressure remains elevated at 140/100 mmHg despite current medication regimen. Reports nocturnal headaches and inconsistent home monitoring. Current medications include hydralazine , which is being increased to improve control. - Increased hydralazine  to 100 mg three times a day. Continue amlodipine  10 mg and chlorthalidone .. - Encouraged home blood pressure monitoring and reporting via MyChart.  On review pt is not on metroprolol presently though should be.   Wesley Obrien is a 44 year old male with hypertension who presents for follow-up of elevated blood pressure readings. Wife on phone during visit.  He has had recent markedly elevated blood pressures, with a peak of 189/101 mmHg a few days ago associated with severe headaches. The headaches improved with rest. He took ibuprofen  which I educated him today not to use nsaids since bp not well controlled.  His blood pressure has since improved but remains elevated, with a clinic reading of 132/108 mmHg and home readings fluctuating up to 166/144 mmHg.  He believes ibuprofen  use for headaches and vaping contributed to these spikes. He has stopped both, with vaping stopped 2 days ago.  His medications include hydralzaine 100 mg three times daily, amlodipine  10 mg daily, chlorthalidone  25 mg daily, metoprolol  50 mg daily, hydralazine  100 mg, rosuvastatin 10 mg daily, and metformin  for prediabetes. He recently ran out of metoprolol  and is awaiting a refill. His wife assists with blood pressure monitoring using both an automated machine and a manual cuff. He reports recent improvement in glycemic control, with A1c correlating to 118 mg/dL glucose.   Past Medical History:  Diagnosis Date   Angio-edema    History of chicken pox    Hypertension    Urticaria      Social History    Socioeconomic History   Marital status: Single    Spouse name: Not on file   Number of children: Not on file   Years of education: Not on file   Highest education level: GED or equivalent  Occupational History   Not on file  Tobacco Use   Smoking status: Former    Types: Cigars    Quit date: 04/16/2016    Years since quitting: 8.0    Passive exposure: Past   Smokeless tobacco: Never  Vaping Use   Vaping status: Some Days  Substance and Sexual Activity   Alcohol use: Yes    Comment: 6 pack 2-3 times a month.   Drug use: No   Sexual activity: Yes    Birth control/protection: None  Other Topics Concern   Not on file  Social History Narrative   Not on file   Social Drivers of Health   Tobacco Use: Medium Risk (04/11/2024)   Patient History    Smoking Tobacco Use: Former    Smokeless Tobacco Use: Never    Passive Exposure: Past  Physicist, Medical Strain: Medium Risk (04/01/2023)   Overall Financial Resource Strain (CARDIA)    Difficulty of Paying Living Expenses: Somewhat hard  Food Insecurity: No Food Insecurity (04/01/2023)   Hunger Vital Sign    Worried About Running Out of Food in the Last Year: Never true    Ran Out of Food in the Last Year: Never true  Transportation Needs: No Transportation Needs (04/01/2023)   PRAPARE - Administrator, Civil Service (Medical): No  Lack of Transportation (Non-Medical): No  Physical Activity: Unknown (04/01/2023)   Exercise Vital Sign    Days of Exercise per Week: 0 days    Minutes of Exercise per Session: Not on file  Stress: Stress Concern Present (04/01/2023)   [employer] of Occupational Health - Occupational Stress Questionnaire    Feeling of Stress : To some extent  Social Connections: Moderately Isolated (04/01/2023)   Social Connection and Isolation Panel    Frequency of Communication with Friends and Family: More than three times a week    Frequency of Social Gatherings with Friends and Family:  Once a week    Attends Religious Services: Never    Database Administrator or Organizations: No    Attends Engineer, Structural: Not on file    Marital Status: Living with partner  Intimate Partner Violence: Not on file  Depression (PHQ2-9): Medium Risk (04/11/2024)   Depression (PHQ2-9)    PHQ-2 Score: 8  Alcohol Screen: Low Risk (04/01/2023)   Alcohol Screen    Last Alcohol Screening Score (AUDIT): 0  Housing: High Risk (04/01/2023)   Housing Stability Vital Sign    Unable to Pay for Housing in the Last Year: Yes    Number of Times Moved in the Last Year: 0    Homeless in the Last Year: No  Utilities: Not on file  Health Literacy: Not on file    Past Surgical History:  Procedure Laterality Date   NO PAST SURGERIES      Family History  Problem Relation Age of Onset   Allergic rhinitis Mother    Healthy Mother    Diabetes Father    Hypertension Father    Healthy Sister    Allergic rhinitis Son    Healthy Son     Allergies[1]  Medications Ordered Prior to Encounter[2]  BP (!) 130/98   Pulse 78   Temp 97.9 F (36.6 C) (Oral)   Resp 15   Ht 6' (1.829 m)   Wt (!) 366 lb 6.4 oz (166.2 kg)   SpO2 96%   BMI 49.69 kg/m      Review of Systems  Constitutional:  Negative for chills, fatigue and fever.  Respiratory:  Negative for cough, chest tightness, wheezing and stridor.   Cardiovascular:  Negative for chest pain and palpitations.  Gastrointestinal:  Negative for abdominal pain.  Genitourinary:  Negative for dysuria.  Musculoskeletal:  Negative for back pain.  Neurological:  Negative for dizziness, speech difficulty, weakness and light-headedness.       No symptoms presently.  Hematological:  Negative for adenopathy.  Psychiatric/Behavioral:  Negative for behavioral problems, decreased concentration and dysphoric mood.     Past Medical History:  Diagnosis Date   Angio-edema    History of chicken pox    Hypertension    Urticaria      Social  History   Socioeconomic History   Marital status: Single    Spouse name: Not on file   Number of children: Not on file   Years of education: Not on file   Highest education level: GED or equivalent  Occupational History   Not on file  Tobacco Use   Smoking status: Former    Types: Cigars    Quit date: 04/16/2016    Years since quitting: 8.0    Passive exposure: Past   Smokeless tobacco: Never  Vaping Use   Vaping status: Some Days  Substance and Sexual Activity   Alcohol use: Yes  Comment: 6 pack 2-3 times a month.   Drug use: No   Sexual activity: Yes    Birth control/protection: None  Other Topics Concern   Not on file  Social History Narrative   Not on file   Social Drivers of Health   Tobacco Use: Medium Risk (04/11/2024)   Patient History    Smoking Tobacco Use: Former    Smokeless Tobacco Use: Never    Passive Exposure: Past  Physicist, Medical Strain: Medium Risk (04/01/2023)   Overall Financial Resource Strain (CARDIA)    Difficulty of Paying Living Expenses: Somewhat hard  Food Insecurity: No Food Insecurity (04/01/2023)   Hunger Vital Sign    Worried About Running Out of Food in the Last Year: Never true    Ran Out of Food in the Last Year: Never true  Transportation Needs: No Transportation Needs (04/01/2023)   PRAPARE - Administrator, Civil Service (Medical): No    Lack of Transportation (Non-Medical): No  Physical Activity: Unknown (04/01/2023)   Exercise Vital Sign    Days of Exercise per Week: 0 days    Minutes of Exercise per Session: Not on file  Stress: Stress Concern Present (04/01/2023)   [employer] of Occupational Health - Occupational Stress Questionnaire    Feeling of Stress : To some extent  Social Connections: Moderately Isolated (04/01/2023)   Social Connection and Isolation Panel    Frequency of Communication with Friends and Family: More than three times a week    Frequency of Social Gatherings with Friends and  Family: Once a week    Attends Religious Services: Never    Database Administrator or Organizations: No    Attends Engineer, Structural: Not on file    Marital Status: Living with partner  Intimate Partner Violence: Not on file  Depression (PHQ2-9): Medium Risk (04/11/2024)   Depression (PHQ2-9)    PHQ-2 Score: 8  Alcohol Screen: Low Risk (04/01/2023)   Alcohol Screen    Last Alcohol Screening Score (AUDIT): 0  Housing: High Risk (04/01/2023)   Housing Stability Vital Sign    Unable to Pay for Housing in the Last Year: Yes    Number of Times Moved in the Last Year: 0    Homeless in the Last Year: No  Utilities: Not on file  Health Literacy: Not on file    Past Surgical History:  Procedure Laterality Date   NO PAST SURGERIES      Family History  Problem Relation Age of Onset   Allergic rhinitis Mother    Healthy Mother    Diabetes Father    Hypertension Father    Healthy Sister    Allergic rhinitis Son    Healthy Son     Allergies[3]  Medications Ordered Prior to Encounter[4]  BP (!) 130/98   Pulse 78   Temp 97.9 F (36.6 C) (Oral)   Resp 15   Ht 6' (1.829 m)   Wt (!) 366 lb 6.4 oz (166.2 kg)   SpO2 96%   BMI 49.69 kg/m        Objective:   Physical Exam  General Mental Status- Alert. General Appearance- Not in acute distress.   Skin General: Color- Normal Color. Moisture- Normal Moisture.  Neck Carotid Arteries- Normal color. Moisture- Normal Moisture. No carotid bruits. No JVD.  Chest and Lung Exam Auscultation: Breath Sounds:-CTA  Cardiovascular Auscultation:Rythm- RRR Murmurs & Other Heart Sounds:Auscultation of the heart reveals- No Murmurs.  Abdomen Inspection:-Inspeection Normal.  Palpation/Percussion:Note:No mass. Palpation and Percussion of the abdomen reveal- Non Tender, Non Distended + BS, no rebound or guarding.   Neurologic Cranial Nerve exam:- CN III-XII intact(No nystagmus), symmetric smile. Strength:- 5/5 equal and  symmetric strength both upper and lower extremities.        Assessment & Plan:   Patient Instructions  Hypertension(130/98 on recheck today) Blood pressure remains elevated despite current regimen. Nicotine use and NSAIDs may contribute. Metoprolol  refill given Consideration for cardiology referral if uncontrolled. - Refilled metoprolol  XL 50 mg, 90 tablets with 3 refills. -continue  Hydralazine  100 mg three times daily, amlodipine  10 mg daily, chlorthalidone  25 mg daily - Monitor blood pressure daily, report via MyChart in two weeks. - Avoid NSAIDs; use Tylenol  for headaches. - Maintain a food diary for two weeks. Low salt diet. - Consider cardiology referral if uncontrolled. - Consider clonidine with caution if needed after my chart update in 2 weeks.  Prediabetes A1c improved to 118 mg/dL. Metformin  effective. - Continue metformin  as prescribed. -we had my chart discussion on your request for insulin  level. Go ahead and get that today.     Follow up date to be determined after my chart update in 2 weeks.   Joseph Johns, PA-C     [1]  Allergies Allergen Reactions   Clonidine And Derivatives Swelling    Facial swelling.  [2]  Current Outpatient Medications on File Prior to Visit  Medication Sig Dispense Refill   amLODipine  (NORVASC ) 10 MG tablet Take 1 tablet (10 mg total) by mouth daily. 30 tablet 1   atorvastatin  (LIPITOR) 10 MG tablet Take 1 tablet (10 mg total) by mouth daily. 90 tablet 3   chlorthalidone  (HYGROTON ) 25 MG tablet TAKE 1 TABLET (25 MG TOTAL) BY MOUTH DAILY. 90 tablet 0   hydrALAZINE  (APRESOLINE ) 100 MG tablet Take 1 tablet (100 mg total) by mouth 3 (three) times daily. 90 tablet 0   metFORMIN  (GLUCOPHAGE ) 500 MG tablet Take 1 tablet (500 mg total) by mouth daily with breakfast. 90 tablet 3   metoprolol  succinate (TOPROL -XL) 50 MG 24 hr tablet TAKE 1 TABLET BY MOUTH DAILY  WITH OR IMMEDIATELY FOLLOWING A  MEAL 30 tablet 1   Olopatadine -Mometasone  (RYALTRIS ) 665-25 MCG/ACT SUSP Place 2 sprays into the nose in the morning and at bedtime. (Patient taking differently: Place 2 sprays into the nose in the morning and at bedtime. As needed) 29 g 5   potassium chloride  (KLOR-CON  M) 10 MEQ tablet Take 1 tablet (10 mEq total) by mouth daily. 7 tablet 0   No current facility-administered medications on file prior to visit.  [3]  Allergies Allergen Reactions   Clonidine And Derivatives Swelling    Facial swelling.  [4]  Current Outpatient Medications on File Prior to Visit  Medication Sig Dispense Refill   amLODipine  (NORVASC ) 10 MG tablet Take 1 tablet (10 mg total) by mouth daily. 30 tablet 1   atorvastatin  (LIPITOR) 10 MG tablet Take 1 tablet (10 mg total) by mouth daily. 90 tablet 3   chlorthalidone  (HYGROTON ) 25 MG tablet TAKE 1 TABLET (25 MG TOTAL) BY MOUTH DAILY. 90 tablet 0   hydrALAZINE  (APRESOLINE ) 100 MG tablet Take 1 tablet (100 mg total) by mouth 3 (three) times daily. 90 tablet 0   metFORMIN  (GLUCOPHAGE ) 500 MG tablet Take 1 tablet (500 mg total) by mouth daily with breakfast. 90 tablet 3   metoprolol  succinate (TOPROL -XL) 50 MG 24 hr tablet TAKE 1 TABLET BY MOUTH DAILY  WITH OR  IMMEDIATELY FOLLOWING A  MEAL 30 tablet 1   Olopatadine -Mometasone (RYALTRIS ) 665-25 MCG/ACT SUSP Place 2 sprays into the nose in the morning and at bedtime. (Patient taking differently: Place 2 sprays into the nose in the morning and at bedtime. As needed) 29 g 5   potassium chloride  (KLOR-CON  M) 10 MEQ tablet Take 1 tablet (10 mEq total) by mouth daily. 7 tablet 0   No current facility-administered medications on file prior to visit.   "

## 2024-04-28 NOTE — Patient Instructions (Addendum)
 Hypertension(130/98 on recheck today) Blood pressure remains elevated despite current regimen. Nicotine use and NSAIDs may contribute. Metoprolol  refill given Consideration for cardiology referral if uncontrolled. - Refilled metoprolol  XL 50 mg, 90 tablets with 3 refills. -continue  Hydralazine  100 mg three times daily, amlodipine  10 mg daily, chlorthalidone  25 mg daily - Monitor blood pressure daily, report via MyChart in two weeks. - Avoid NSAIDs; use Tylenol  for headaches. - Maintain a food diary for two weeks. Low salt diet. - Consider cardiology referral if uncontrolled. - Consider clonidine with caution if needed after my chart update in 2 weeks.  Prediabetes A1c improved to 118 mg/dL. Metformin  effective. - Continue metformin  as prescribed. -we had my chart discussion on your request for insulin  level. Go ahead and get that today.    Follow up date to be determined after my chart update in 2 weeks.

## 2024-05-01 ENCOUNTER — Ambulatory Visit: Payer: Self-pay | Admitting: Medical

## 2024-05-01 LAB — INSULIN, RANDOM: Insulin: 8.9 u[IU]/mL

## 2024-05-12 ENCOUNTER — Encounter: Payer: Self-pay | Admitting: Medical

## 2024-05-12 MED ORDER — AMLODIPINE BESYLATE 10 MG PO TABS: 10.0000 mg | ORAL_TABLET | Freq: Every day | ORAL | 3 refills | Status: AC

## 2024-05-12 NOTE — Addendum Note (Signed)
 Addended by: DORINA DALLAS DORINA PA-C M on: 05/12/2024 08:43 PM   Modules accepted: Orders

## 2024-05-16 NOTE — Telephone Encounter (Signed)
 Called pt and wife. Got him scheduled for Thursday 05/18/2024

## 2024-05-18 ENCOUNTER — Other Ambulatory Visit: Payer: Self-pay | Admitting: Medical

## 2024-05-18 ENCOUNTER — Ambulatory Visit: Admitting: Medical

## 2024-05-18 ENCOUNTER — Encounter: Payer: Self-pay | Admitting: Medical

## 2024-05-18 ENCOUNTER — Ambulatory Visit: Payer: Self-pay | Admitting: Medical

## 2024-05-18 VITALS — BP 140/95 | HR 69 | Temp 97.8°F | Resp 15 | Ht 72.0 in | Wt 374.6 lb

## 2024-05-18 DIAGNOSIS — E876 Hypokalemia: Secondary | ICD-10-CM | POA: Diagnosis not present

## 2024-05-18 DIAGNOSIS — I10 Essential (primary) hypertension: Secondary | ICD-10-CM | POA: Diagnosis not present

## 2024-05-18 LAB — COMPREHENSIVE METABOLIC PANEL WITH GFR
ALT: 37 U/L (ref 3–53)
AST: 21 U/L (ref 5–37)
Albumin: 4.1 g/dL (ref 3.5–5.2)
Alkaline Phosphatase: 78 U/L (ref 39–117)
BUN: 10 mg/dL (ref 6–23)
CO2: 35 meq/L — ABNORMAL HIGH (ref 19–32)
Calcium: 9.1 mg/dL (ref 8.4–10.5)
Chloride: 103 meq/L (ref 96–112)
Creatinine, Ser: 1.22 mg/dL (ref 0.40–1.50)
GFR: 73.11 mL/min
Glucose, Bld: 94 mg/dL (ref 70–99)
Potassium: 3.9 meq/L (ref 3.5–5.1)
Sodium: 140 meq/L (ref 135–145)
Total Bilirubin: 0.6 mg/dL (ref 0.2–1.2)
Total Protein: 7.1 g/dL (ref 6.0–8.3)

## 2024-05-18 NOTE — Telephone Encounter (Unsigned)
 Copied from CRM #[id number]. Topic: Clinical - Medication Refill >> May 18, 2024 11:36 AM Frederich PARAS wrote: Medication:  metFORMIN  (GLUCOPHAGE ) 500 MG tablet, atorvastatin  (LIPITOR) 10 MG tablet   Has the patient contacted their pharmacy? No (Agent: If no, request that the patient contact the pharmacy for the refill. If patient does not wish to contact the pharmacy document the reason why and proceed with request.) (Agent: If yes, when and what did the pharmacy advise?)  This is the patient's preferred pharmacy:  [id number] [place], [place] - [address] [place] [address] Phone: [phone] Fax: [phone]  [id number] [place], [place] - [address] [place] [address] Phone: [phone] Fax: [phone]  Is this the correct pharmacy for this prescription? Yes If no, delete pharmacy and type the correct one.   Has the prescription been filled recently? Yes  Is the patient out of the medication? Yes  Has the patient been seen for an appointment in the last year OR does the patient have an upcoming appointment? Yes  Can we respond through MyChart? Yes  Agent: Please be advised that Rx refills may take up to 3 business days. We ask that you follow-up with your pharmacy.
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
## 2024-05-18 NOTE — Patient Instructions (Signed)
 Essential hypertension Blood pressure elevated at 150/110 mmHg. Amlodipine  missed for two weeks, contributing to elevation. Nicotine cessation beneficial. Current regimen includes metoprolol , hydralazine , amlodipine , and chlorthalidone . Amlodipine  requires 7-14 days for full effect. Blood pressure decreasing. - Continue metoprolol , hydralazine , amlodipine , and chlorthalidone . - Ensure consistent amlodipine  intake. - Schedule nurse blood pressure check in one week with two readings 5-10 minutes apart. -send me my chart message as want to verify that you have been using chlorthalidone .  Hypokalemia Low potassium levels noted. Potassium supplementation prescribed and taken. Chlorthalidone  may contribute to hypokalemia. - Recheck potassium levels today. - Ensure adherence to potassium supplementation. - Monitor potassium levels and adjust treatment as necessary.  Follow up one week nurse bp check.

## 2024-05-20 MED ORDER — METFORMIN HCL 500 MG PO TABS: 500.0000 mg | ORAL_TABLET | Freq: Every day | ORAL | 3 refills | Status: AC

## 2024-05-20 MED ORDER — ATORVASTATIN CALCIUM 10 MG PO TABS: 10.0000 mg | ORAL_TABLET | Freq: Every day | ORAL | 3 refills | Status: AC

## 2024-05-20 NOTE — Telephone Encounter (Signed)
 Refilled both meds. Make sure he is scheduled for nurse bp check this coming week. After bp check then will advise when is next follow up appointment.

## 2024-05-22 ENCOUNTER — Ambulatory Visit (HOSPITAL_BASED_OUTPATIENT_CLINIC_OR_DEPARTMENT_OTHER)

## 2024-05-22 NOTE — Telephone Encounter (Signed)
"  Appt scheduled 05/29/24  "

## 2024-05-29 ENCOUNTER — Ambulatory Visit
